# Patient Record
Sex: Female | Born: 1990 | Race: White | Hispanic: No | Marital: Single | Smoking: Former smoker
Health system: Southern US, Community
[De-identification: ages and names within clinical notes are randomized; demographics above are authoritative.]

## PROBLEM LIST (undated history)

## (undated) DIAGNOSIS — R51 Headache: Secondary | ICD-10-CM

## (undated) DIAGNOSIS — Z789 Other specified health status: Secondary | ICD-10-CM

## (undated) DIAGNOSIS — R519 Headache, unspecified: Secondary | ICD-10-CM

## (undated) HISTORY — PX: NO PAST SURGERIES: SHX2092

## (undated) HISTORY — DX: Other specified health status: Z78.9

---

## 2012-06-03 NOTE — L&D Delivery Note (Signed)
Delivery Note At 12:27 AM a viable and healthy female was delivered via Vaginal, Spontaneous Delivery (Presentation: Right Occiput Anterior).  APGAR: 8, 9; weight -pending   Placenta status: Intact, Spontaneous.  Cord:  with the following complications: None.  Cord pH: not sent  Anesthesia: Epidural  Episiotomy: None Lacerations: Labial;Vaginal Suture Repair: 3.0 vicryl rapide Est. Blood Loss (mL): 250  Mom to postpartum.  Baby to Couplet care / Skin to Skin.  Nylan Nakatani R 05/30/2013, 1:00 AM

## 2012-10-12 ENCOUNTER — Emergency Department (HOSPITAL_COMMUNITY)
Admission: EM | Admit: 2012-10-12 | Discharge: 2012-10-12 | Disposition: A | Discharge status: Home / Self Care | Payer: Managed Care, Other (non HMO) | Attending: Emergency Medicine | Admitting: Emergency Medicine

## 2012-10-12 ENCOUNTER — Emergency Department (HOSPITAL_COMMUNITY): Payer: Managed Care, Other (non HMO)

## 2012-10-12 ENCOUNTER — Encounter (HOSPITAL_COMMUNITY): Payer: Self-pay | Admitting: Emergency Medicine

## 2012-10-12 DIAGNOSIS — Z87891 Personal history of nicotine dependence: Secondary | ICD-10-CM | POA: Insufficient documentation

## 2012-10-12 DIAGNOSIS — Z3490 Encounter for supervision of normal pregnancy, unspecified, unspecified trimester: Secondary | ICD-10-CM

## 2012-10-12 DIAGNOSIS — Z202 Contact with and (suspected) exposure to infections with a predominantly sexual mode of transmission: Secondary | ICD-10-CM

## 2012-10-12 DIAGNOSIS — Z3201 Encounter for pregnancy test, result positive: Secondary | ICD-10-CM | POA: Insufficient documentation

## 2012-10-12 LAB — URINALYSIS, ROUTINE W REFLEX MICROSCOPIC
Bilirubin Urine: NEGATIVE
Glucose, UA: NEGATIVE mg/dL
Hgb urine dipstick: NEGATIVE
Ketones, ur: NEGATIVE mg/dL
Nitrite: NEGATIVE
Protein, ur: NEGATIVE mg/dL
Specific Gravity, Urine: 1.031 — ABNORMAL HIGH (ref 1.005–1.030)
Urobilinogen, UA: 0.2 mg/dL (ref 0.0–1.0)
pH: 5.5 (ref 5.0–8.0)

## 2012-10-12 LAB — OB RESULTS CONSOLE GC/CHLAMYDIA: Chlamydia: NEGATIVE

## 2012-10-12 LAB — URINE MICROSCOPIC-ADD ON

## 2012-10-12 LAB — HCG, QUANTITATIVE, PREGNANCY: hCG, Beta Chain, Quant, S: 467 m[IU]/mL — ABNORMAL HIGH (ref <5)

## 2012-10-12 LAB — POCT PREGNANCY, URINE: Preg Test, Ur: POSITIVE — AB

## 2012-10-12 LAB — WET PREP, GENITAL

## 2012-10-12 MED ORDER — AZITHROMYCIN 250 MG PO TABS
1000.0000 mg | ORAL_TABLET | Freq: Once | ORAL | Status: AC
  Administered 2012-10-12: 1000 mg via ORAL
  Filled 2012-10-12: qty 4

## 2012-10-12 MED ORDER — CEFTRIAXONE SODIUM 250 MG IJ SOLR
250.0000 mg | Freq: Once | INTRAMUSCULAR | Status: AC
  Administered 2012-10-12: 250 mg via INTRAMUSCULAR
  Filled 2012-10-12: qty 250

## 2012-10-12 NOTE — ED Provider Notes (Signed)
History     CSN: [id number]  Arrival date & time 10/12/12  1058   First MD Initiated Contact with Patient 10/12/12 1507      Chief Complaint  Patient presents with  . Exposure to STD    (Consider location/radiation/quality/duration/timing/severity/associated sxs/prior treatment) HPI Comments: Patient presents to the ED for STD exposure. Reports that her partner was tested for possible UTI, and results came back as positive for Chlamydia. They have been sexually active since testing and she is concerned for possible infection. Denies any prior STDs. Denies any vaginal discharge, dysuria, hematuria, or increased frequency of urination. Patient also notes she took a home pregnancy test recently which was positive.  No prior pregnancies. He is experiencing slight intermittent, suprapubic cramping sensations which are short lived. Denies any recent fever, sweats, chills, nausea, vomiting, or diarrhea.  The history is provided by the patient.    History reviewed. No pertinent past medical history.  History reviewed. No pertinent past surgical history.  No family history on file.  History  Substance Use Topics  . Smoking status: Former Smoker    Types: Cigarettes  . Smokeless tobacco: Not on file  . Alcohol Use: Not on file    OB History   Grav Para Term Preterm Abortions TAB SAB Ect Mult Living                  Review of Systems  Genitourinary:       STD exposure, + preg  All other systems reviewed and are negative.    Allergies  Review of patient's allergies indicates no known allergies.  Home Medications   Current Outpatient Rx  Name  Route  Sig  Dispense  Refill  . aspirin-acetaminophen-caffeine (EXCEDRIN MIGRAINE) 250-250-65 MG per tablet   Oral   Take 1 tablet by mouth every 6 (six) hours as needed for pain.           BP 129/71  Pulse 89  Temp(Src) 98.5 F (36.9 C) (Oral)  Resp 16  SpO2 100%  LMP 09/08/2012  Physical Exam  Nursing note and vitals  reviewed. Constitutional: She is oriented to person, place, and time. She appears well-developed and well-nourished.  HENT:  Head: Normocephalic and atraumatic.  Eyes: Conjunctivae and EOM are normal.  Neck: Normal range of motion. Neck supple.  Cardiovascular: Normal rate, regular rhythm and normal heart sounds.   Pulmonary/Chest: Effort normal and breath sounds normal. She has no wheezes.  Abdominal: Soft. Bowel sounds are normal. There is no tenderness. There is no guarding.  Genitourinary: Vagina normal. There is no lesion on the right labia. There is no lesion on the left labia. Cervix exhibits no motion tenderness. Right adnexum displays no tenderness. Left adnexum displays no tenderness. No bleeding around the vagina. No vaginal discharge found.  Cervical os closed, no vaginal discharge or bleeding, no adnexal or CMT  Musculoskeletal: Normal range of motion.  Neurological: She is alert and oriented to person, place, and time.  Skin: Skin is warm and dry.  Psychiatric: She has a normal mood and affect.    ED Course  Procedures (including critical care time)  Labs Reviewed  WET PREP, GENITAL - Abnormal; Notable for the following:    Clue Cells Wet Prep HPF POC FEW (*)    WBC, Wet Prep HPF POC MODERATE (*)    All other components within normal limits  URINALYSIS, ROUTINE W REFLEX MICROSCOPIC - Abnormal; Notable for the following:    APPearance CLOUDY (*)  Specific Gravity, Urine 1.031 (*)    Leukocytes, UA LARGE (*)    All other components within normal limits  URINE MICROSCOPIC-ADD ON - Abnormal; Notable for the following:    Squamous Epithelial / LPF FEW (*)    Bacteria, UA MANY (*)    All other components within normal limits  HCG, QUANTITATIVE, PREGNANCY - Abnormal; Notable for the following:    hCG, Beta Chain, Quant, S 467 (*)    All other components within normal limits  POCT PREGNANCY, URINE - Abnormal; Notable for the following:    Preg Test, Ur POSITIVE (*)     All other components within normal limits  POCT PREGNANCY, URINE - Abnormal; Notable for the following:    Preg Test, Ur POSITIVE (*)    All other components within normal limits  URINE CULTURE  GC/CHLAMYDIA PROBE AMP   US Ob Comp Less 14 Wks  10/12/2012  *RADIOLOGY REPORT*  Clinical Data: Positive pregnancy test.  Cramping.  OBSTETRIC <14 WK [place] AND TRANSVAGINAL OB US  Technique:  Both transabdominal and transvaginal ultrasound examinations were performed for complete evaluation of the gestation as well as the maternal uterus, adnexal regions, and pelvic cul-de-sac.  Transvaginal technique was performed to assess early pregnancy.  Comparison:  None.  Intrauterine gestational sac:  Not identified Yolk sac: Not identified Embryo: Not identified  Maternal uterus/adnexae: The endometrium is thickened to 20 mm.  No fluid in the canal.  The ovaries are normal.  There is a hypoechoic region within the right ovary measuring 1.8 cm.  This potentially could represent a corpus luteal cyst.  Small amount free fluid in the posterior cul-de-sac and adjacent the right adnexa.  No evidence of adnexal mass.  IMPRESSION:  1.  No intrauterine gestational sac with thickened endometrium. Findings consistent with early intrauterine pregnancy versus ectopic pregnancy versus spontaneous abortion in progress. 2.  Small amount free fluid within the right adnexa and posterior cul-de-sac.  No evidence of right adnexal mass.   Original Report Authenticated By: Genevive Bi, M.D.    US Ob Transvaginal  10/12/2012  *RADIOLOGY REPORT*  Clinical Data: Positive pregnancy test.  Cramping.  OBSTETRIC <14 WK [place] AND TRANSVAGINAL OB US  Technique:  Both transabdominal and transvaginal ultrasound examinations were performed for complete evaluation of the gestation as well as the maternal uterus, adnexal regions, and pelvic cul-de-sac.  Transvaginal technique was performed to assess early pregnancy.  Comparison:  None.  Intrauterine  gestational sac:  Not identified Yolk sac: Not identified Embryo: Not identified  Maternal uterus/adnexae: The endometrium is thickened to 20 mm.  No fluid in the canal.  The ovaries are normal.  There is a hypoechoic region within the right ovary measuring 1.8 cm.  This potentially could represent a corpus luteal cyst.  Small amount free fluid in the posterior cul-de-sac and adjacent the right adnexa.  No evidence of adnexal mass.  IMPRESSION:  1.  No intrauterine gestational sac with thickened endometrium. Findings consistent with early intrauterine pregnancy versus ectopic pregnancy versus spontaneous abortion in progress. 2.  Small amount free fluid within the right adnexa and posterior cul-de-sac.  No evidence of right adnexal mass.   Original Report Authenticated By: Genevive Bi, M.D.      1. Pregnancy   2. Exposure to STD       MDM   22 y.o. F presenting to the ED for STD exposure and positive home pregnancy test.  u-preg Positive.   Cervical os closed, no evidence of  vaginal discharge or vaginal bleeding.  HCG 467, correlating with early pregnancy of 2-3 weeks.  No intrauterine gestational sac visualized- early pregnancy versus ectopic pregnancy versus spontaneous abortion.  The patient treated in the ED for GC/CHL.   [facility].  Discussed plan with patient, she agreed. Return precautions advised.        Garlon Hatchet, PA-C 10/12/12 2350

## 2012-10-12 NOTE — ED Notes (Signed)
Pt escorted to discharge window. Verbalized understanding discharge instructions. In no acute distress.   

## 2012-10-12 NOTE — ED Notes (Signed)
Patient transported to MRI 

## 2012-10-12 NOTE — ED Notes (Signed)
Pt states "my boyfriend tested positive for an STD and I need to be checked" Pt denies any discharge or symptoms at this time.

## 2012-10-13 LAB — URINE CULTURE: Colony Count: NO GROWTH

## 2012-10-13 NOTE — ED Provider Notes (Signed)
Medical screening examination/treatment/procedure(s) were performed by non-physician practitioner and as supervising physician I was immediately available for consultation/collaboration.   Gavin Pound. Eyob Godlewski, MD 10/13/12 1423

## 2012-10-14 LAB — GC/CHLAMYDIA PROBE AMP: GC Probe RNA: NEGATIVE

## 2012-10-16 ENCOUNTER — Telehealth (HOSPITAL_COMMUNITY): Payer: Self-pay | Admitting: Emergency Medicine

## 2012-10-16 NOTE — ED Notes (Signed)
+   Chlamydia + Gonorrhea Patient treated with Rocephin And Zithromax-[facility] faxed 

## 2012-10-17 ENCOUNTER — Telehealth (HOSPITAL_COMMUNITY): Payer: Self-pay | Admitting: Emergency Medicine

## 2012-10-18 ENCOUNTER — Telehealth (HOSPITAL_COMMUNITY): Payer: Self-pay | Admitting: Emergency Medicine

## 2012-10-19 ENCOUNTER — Other Ambulatory Visit: Payer: Managed Care, Other (non HMO)

## 2012-10-19 DIAGNOSIS — E349 Endocrine disorder, unspecified: Secondary | ICD-10-CM

## 2012-10-20 ENCOUNTER — Telehealth (HOSPITAL_COMMUNITY): Payer: Self-pay | Admitting: Emergency Medicine

## 2012-10-20 LAB — HCG, QUANTITATIVE, PREGNANCY: hCG, Beta Chain, Quant, S: 11544.4 m[IU]/mL

## 2012-10-20 NOTE — ED Notes (Signed)
Unable to contact patient via phone. Sent letter. °

## 2012-12-22 ENCOUNTER — Ambulatory Visit (INDEPENDENT_AMBULATORY_CARE_PROVIDER_SITE_OTHER): Payer: Medicaid Other

## 2012-12-22 VITALS — BP 138/81 | Temp 97.8°F | Ht 63.0 in | Wt 128.0 lb

## 2012-12-22 DIAGNOSIS — Z3400 Encounter for supervision of normal first pregnancy, unspecified trimester: Secondary | ICD-10-CM

## 2012-12-22 DIAGNOSIS — Z3402 Encounter for supervision of normal first pregnancy, second trimester: Secondary | ICD-10-CM

## 2012-12-22 LAB — POCT URINALYSIS DIP (DEVICE)
Bilirubin Urine: NEGATIVE
Glucose, UA: NEGATIVE mg/dL
Hgb urine dipstick: NEGATIVE
Nitrite: NEGATIVE
Urobilinogen, UA: 0.2 mg/dL (ref 0.0–1.0)
pH: 5.5 (ref 5.0–8.0)

## 2012-12-22 NOTE — Progress Notes (Signed)
Pulse- 94  Edema- feet Weight gain 25-35lbs New ob packet given

## 2012-12-23 ENCOUNTER — Encounter: Payer: Self-pay | Admitting: *Deleted

## 2012-12-23 DIAGNOSIS — Z3480 Encounter for supervision of other normal pregnancy, unspecified trimester: Secondary | ICD-10-CM | POA: Insufficient documentation

## 2012-12-23 LAB — OBSTETRIC PANEL
Basophils Absolute: 0 10*3/uL (ref 0.0–0.1)
Hepatitis B Surface Ag: NEGATIVE
Lymphocytes Relative: 17 % (ref 12–46)
Lymphs Abs: 1.9 10*3/uL (ref 0.7–4.0)
Neutro Abs: 8.6 10*3/uL — ABNORMAL HIGH (ref 1.7–7.7)
Neutrophils Relative %: 76 % (ref 43–77)
Platelets: 311 10*3/uL (ref 150–400)
RBC: 4.36 MIL/uL (ref 3.87–5.11)
RDW: 13.3 % (ref 11.5–15.5)
Rubella: 1.31 Index — ABNORMAL HIGH (ref <0.90)
WBC: 11.3 10*3/uL — ABNORMAL HIGH (ref 4.0–10.5)

## 2012-12-23 LAB — HIV ANTIBODY (ROUTINE TESTING W REFLEX): HIV: NONREACTIVE

## 2012-12-24 LAB — CULTURE, OB URINE: Colony Count: 30000

## 2012-12-25 ENCOUNTER — Encounter: Payer: Self-pay | Admitting: Family Medicine

## 2012-12-25 ENCOUNTER — Ambulatory Visit (HOSPITAL_COMMUNITY)
Admission: RE | Admit: 2012-12-25 | Discharge: 2012-12-25 | Disposition: A | Discharge status: Home / Self Care | Payer: Medicaid Other | Source: Ambulatory Visit | Attending: Obstetrics & Gynecology | Admitting: Obstetrics & Gynecology

## 2012-12-25 ENCOUNTER — Ambulatory Visit (INDEPENDENT_AMBULATORY_CARE_PROVIDER_SITE_OTHER): Payer: Medicaid Other | Admitting: Family Medicine

## 2012-12-25 VITALS — BP 119/76 | Temp 98.3°F | Wt 127.8 lb

## 2012-12-25 DIAGNOSIS — Z3482 Encounter for supervision of other normal pregnancy, second trimester: Secondary | ICD-10-CM

## 2012-12-25 DIAGNOSIS — Z3400 Encounter for supervision of normal first pregnancy, unspecified trimester: Secondary | ICD-10-CM

## 2012-12-25 DIAGNOSIS — Z3402 Encounter for supervision of normal first pregnancy, second trimester: Secondary | ICD-10-CM

## 2012-12-25 DIAGNOSIS — Z3689 Encounter for other specified antenatal screening: Secondary | ICD-10-CM | POA: Insufficient documentation

## 2012-12-25 LAB — POCT URINALYSIS DIP (DEVICE)
Glucose, UA: NEGATIVE mg/dL
Nitrite: NEGATIVE
Protein, ur: NEGATIVE mg/dL
Specific Gravity, Urine: 1.02 (ref 1.005–1.030)
Urobilinogen, UA: 0.2 mg/dL (ref 0.0–1.0)

## 2012-12-25 NOTE — Progress Notes (Signed)
P=81, Already received new patient information.

## 2012-12-25 NOTE — Patient Instructions (Signed)
Pregnancy - Second Trimester The second trimester of pregnancy (3 to 6 months) is a period of rapid growth for you and your baby. At the end of the sixth month, your baby is about 9 inches long and weighs 1 1/2 pounds. You will begin to feel the baby move between 18 and 20 weeks of the pregnancy. This is called quickening. Weight gain is faster. A clear fluid (colostrum) may leak out of your breasts. You may feel small contractions of the womb (uterus). This is known as false labor or Braxton-Hicks contractions. This is like a practice for labor when the baby is ready to be born. Usually, the problems with morning sickness have usually passed by the end of your first trimester. Some women develop small dark blotches (called cholasma, mask of pregnancy) on their face that usually goes away after the baby is born. Exposure to the sun makes the blotches worse. Acne may also develop in some pregnant women and pregnant women who have acne, may find that it goes away. PRENATAL EXAMS  Blood work may continue to be done during prenatal exams. These tests are done to check on your health and the probable health of your baby. Blood work is used to follow your blood levels (hemoglobin). Anemia (low hemoglobin) is common during pregnancy. Iron and vitamins are given to help prevent this. You will also be checked for diabetes between 24 and 28 weeks of the pregnancy. Some of the previous blood tests may be repeated.  The size of the uterus is measured during each visit. This is to make sure that the baby is continuing to grow properly according to the dates of the pregnancy.  Your blood pressure is checked every prenatal visit. This is to make sure you are not getting toxemia.  Your urine is checked to make sure you do not have an infection, diabetes or protein in the urine.  Your weight is checked often to make sure gains are happening at the suggested rate. This is to ensure that both you and your baby are  growing normally.  Sometimes, an ultrasound is performed to confirm the proper growth and development of the baby. This is a test which bounces harmless sound waves off the baby so your caregiver can more accurately determine due dates. Sometimes, a test is done on the amniotic fluid surrounding the baby. This test is called an amniocentesis. The amniotic fluid is obtained by sticking a needle into the belly (abdomen). This is done to check the chromosomes in instances where there is a concern about possible genetic problems with the baby. It is also sometimes done near the end of pregnancy if an early delivery is required. In this case, it is done to help make sure the baby's lungs are mature enough for the baby to live outside of the womb. CHANGES OCCURING IN THE SECOND TRIMESTER OF PREGNANCY Your body goes through many changes during pregnancy. They vary from person to person. Talk to your caregiver about changes you notice that you are concerned about.  During the second trimester, you will likely have an increase in your appetite. It is normal to have cravings for certain foods. This varies from person to person and pregnancy to pregnancy.  Your lower abdomen will begin to bulge.  You may have to urinate more often because the uterus and baby are pressing on your bladder. It is also common to get more bladder infections during pregnancy. You can help this by drinking lots of fluids   and emptying your bladder before and after intercourse.  You may begin to get stretch marks on your hips, abdomen, and breasts. These are normal changes in the body during pregnancy. There are no exercises or medicines to take that prevent this change.  You may begin to develop swollen and bulging veins (varicose veins) in your legs. Wearing support hose, elevating your feet for 15 minutes, 3 to 4 times a day and limiting salt in your diet helps lessen the problem.  Heartburn may develop as the uterus grows and  pushes up against the stomach. Antacids recommended by your caregiver helps with this problem. Also, eating smaller meals 4 to 5 times a day helps.  Constipation can be treated with a stool softener or adding bulk to your diet. Drinking lots of fluids, and eating vegetables, fruits, and whole grains are helpful.  Exercising is also helpful. If you have been very active up until your pregnancy, most of these activities can be continued during your pregnancy. If you have been less active, it is helpful to start an exercise program such as walking.  Hemorrhoids may develop at the end of the second trimester. Warm sitz baths and hemorrhoid cream recommended by your caregiver helps hemorrhoid problems.  Backaches may develop during this time of your pregnancy. Avoid heavy lifting, wear low heal shoes, and practice good posture to help with backache problems.  Some pregnant women develop tingling and numbness of their hand and fingers because of swelling and tightening of ligaments in the wrist (carpel tunnel syndrome). This goes away after the baby is born.  As your breasts enlarge, you may have to get a bigger bra. Get a comfortable, cotton, support bra. Do not get a nursing bra until the last month of the pregnancy if you will be nursing the baby.  You may get a dark line from your belly button to the pubic area called the linea nigra.  You may develop rosy cheeks because of increase blood flow to the face.  You may develop spider looking lines of the face, neck, arms, and chest. These go away after the baby is born. HOME CARE INSTRUCTIONS   It is extremely important to avoid all smoking, herbs, alcohol, and unprescribed drugs during your pregnancy. These chemicals affect the formation and growth of the baby. Avoid these chemicals throughout the pregnancy to ensure the delivery of a healthy infant.  Most of your home care instructions are the same as suggested for the first trimester of your  pregnancy. Keep your caregiver's appointments. Follow your caregiver's instructions regarding medicine use, exercise, and diet.  During pregnancy, you are providing food for you and your baby. Continue to eat regular, well-balanced meals. Choose foods such as meat, fish, milk and other low fat dairy products, vegetables, fruits, and whole-grain breads and cereals. Your caregiver will tell you of the ideal weight gain.  A physical sexual relationship may be continued up until near the end of pregnancy if there are no other problems. Problems could include early (premature) leaking of amniotic fluid from the membranes, vaginal bleeding, abdominal pain, or other medical or pregnancy problems.  Exercise regularly if there are no restrictions. Check with your caregiver if you are unsure of the safety of some of your exercises. The greatest weight gain will occur in the last 2 trimesters of pregnancy. Exercise will help you:  Control your weight.  Get you in shape for labor and delivery.  Lose weight after you have the baby.  Wear   a good support or jogging bra for breast tenderness during pregnancy. This may help if worn during sleep. Pads or tissues may be used in the bra if you are leaking colostrum.  Do not use hot tubs, steam rooms or saunas throughout the pregnancy.  Wear your seat belt at all times when driving. This protects you and your baby if you are in an accident.  Avoid raw meat, uncooked cheese, cat litter boxes, and soil used by cats. These carry germs that can cause birth defects in the baby.  The second trimester is also a good time to visit your dentist for your dental health if this has not been done yet. Getting your teeth cleaned is okay. Use a soft toothbrush. Brush gently during pregnancy.  It is easier to leak urine during pregnancy. Tightening up and strengthening the pelvic muscles will help with this problem. Practice stopping your urination while you are going to the  bathroom. These are the same muscles you need to strengthen. It is also the muscles you would use as if you were trying to stop from passing gas. You can practice tightening these muscles up 10 times a set and repeating this about 3 times per day. Once you know what muscles to tighten up, do not perform these exercises during urination. It is more likely to contribute to an infection by backing up the urine.  Ask for help if you have financial, counseling, or nutritional needs during pregnancy. Your caregiver will be able to offer counseling for these needs as well as refer you for other special needs.  Your skin may become oily. If so, wash your face with mild soap, use non-greasy moisturizer and oil or cream based makeup. MEDICINES AND DRUG USE IN PREGNANCY  Take prenatal vitamins as directed. The vitamin should contain 1 milligram of folic acid. Keep all vitamins out of reach of children. Only a couple vitamins or tablets containing iron may be fatal to a baby or young child when ingested.  Avoid use of all medicines, including herbs, over-the-counter medicines, not prescribed or suggested by your caregiver. Only take over-the-counter or prescription medicines for pain, discomfort, or fever as directed by your caregiver. Do not use aspirin.  Let your caregiver also know about herbs you may be using.  Alcohol is related to a number of birth defects. This includes fetal alcohol syndrome. All alcohol, in any form, should be avoided completely. Smoking will cause low birth rate and premature babies.  Street or illegal drugs are very harmful to the baby. They are absolutely forbidden. A baby born to an addicted mother will be addicted at birth. The baby will go through the same withdrawal an adult does. SEEK MEDICAL CARE IF:  You have any concerns or worries during your pregnancy. It is better to call with your questions if you feel they cannot wait, rather than worry about them. SEEK IMMEDIATE  MEDICAL CARE IF:   An unexplained oral temperature above 102 F (38.9 C) develops, or as your caregiver suggests.  You have leaking of fluid from the vagina (birth canal). If leaking membranes are suspected, take your temperature and tell your caregiver of this when you call.  There is vaginal spotting, bleeding, or passing clots. Tell your caregiver of the amount and how many pads are used. Light spotting in pregnancy is common, especially following intercourse.  You develop a bad smelling vaginal discharge with a change in the color from clear to white.  You continue to feel   sick to your stomach (nauseated) and have no relief from remedies suggested. You vomit blood or coffee ground-like materials.  You lose more than 2 pounds of weight or gain more than 2 pounds of weight over 1 week, or as suggested by your caregiver.  You notice swelling of your face, hands, feet, or legs.  You get exposed to German measles and have never had them.  You are exposed to fifth disease or chickenpox.  You develop belly (abdominal) pain. Round ligament discomfort is a common non-cancerous (benign) cause of abdominal pain in pregnancy. Your caregiver still must evaluate you.  You develop a bad headache that does not go away.  You develop fever, diarrhea, pain with urination, or shortness of breath.  You develop visual problems, blurry, or double vision.  You fall or are in a car accident or any kind of trauma.  There is mental or physical violence at home. Document Released: 05/14/2001 Document Revised: 02/12/2012 Document Reviewed: 11/16/2008 [employer] Patient Information 2014 [employer].  

## 2012-12-25 NOTE — Progress Notes (Signed)
Subjective:    Julie [name] is being seen today for her first obstetrical visit.  This is not a planned pregnancy. She is at [redacted]w[redacted]d gestation. Relationship with FOB: unsure who the father is. Patient does intend to breast feed. Pregnancy history fully reviewed.  Menstrual History: OB History   Grav Para Term Preterm Abortions TAB SAB Ect Mult Living   1               Menarche age: 22  Patient's last menstrual period was 09/02/2012.    The following portions of the patient's history were reviewed and updated as appropriate: allergies, current medications, past family history, past medical history, past social history, past surgical history and problem list.  Review of Systems A comprehensive review of systems was negative.    Objective:    BP 119/76  Temp(Src) 98.3 F (36.8 C)  Wt 127 lb 12.8 oz (57.97 kg)  BMI 22.64 kg/m2  LMP 09/02/2012  General Appearance:    Alert, cooperative, no distress, appears stated age  Head:    Normocephalic, without obvious abnormality, atraumatic  Eyes:  PERRLA  Ears:    Normal external  Nose:   Nares normal, septum midline, mucosa normal, no drainage    or sinus tenderness  Throat:   Lips, mucosa, and tongue normal; teeth and gums normal  Neck:   Supple, symmetrical, trachea midline, no adenopathy;    thyroid:  no enlargement/tenderness/nodules;   Back:     Symmetric, no curvature, ROM normal, no CVA tenderness  Lungs:     Clear to auscultation bilaterally, respirations unlabored  Chest Wall:    No tenderness or deformity   Heart:    Regular rate and rhythm, S1 and S2 normal, no murmur, rub   or gallop  Breast Exam:    deferred  Abdomen:     Soft, non-tender, bowel sounds active all four quadrants,    no masses, no organomegaly  Genitalia:    Normal female without lesion, discharge or tenderness. Uterus feels about the size of 16 weeks. No tenderness. Normal vaginal, urethra, urethral meatus.   Rectal:    Normal tone, normal prostate, no masses  or tenderness;   guaiac negative stool  Extremities:   Extremities normal, atraumatic, no cyanosis or edema  Pulses:   2+ and symmetric all extremities               Assessment:    Pregnancy at 16 and 2/7 weeks    Plan:    Initial labs reviewed- toc for chlamydia negative. Prenatal vitamins rxd. Problem list reviewed and updated. QUAD screen declined Role of ultrasound in pregnancy discussed; fetal survey: requested. Amniocentesis discussed: not indicated. Follow up in 4 weeks. 50% of 30 min visit spent on counseling and coordination of care.

## 2012-12-28 ENCOUNTER — Encounter: Payer: Self-pay | Admitting: Obstetrics & Gynecology

## 2013-01-20 ENCOUNTER — Encounter: Payer: Medicaid Other | Admitting: Advanced Practice Midwife

## 2013-05-29 ENCOUNTER — Inpatient Hospital Stay (HOSPITAL_COMMUNITY): Payer: Managed Care, Other (non HMO) | Admitting: Anesthesiology

## 2013-05-29 ENCOUNTER — Encounter (HOSPITAL_COMMUNITY): Payer: Self-pay

## 2013-05-29 ENCOUNTER — Encounter (HOSPITAL_COMMUNITY): Payer: Managed Care, Other (non HMO) | Admitting: Anesthesiology

## 2013-05-29 ENCOUNTER — Inpatient Hospital Stay (HOSPITAL_COMMUNITY)
Admission: AD | Admit: 2013-05-29 | Discharge: 2013-05-31 | DRG: 775 | Disposition: A | Discharge status: Home / Self Care | Payer: Managed Care, Other (non HMO) | Source: Ambulatory Visit | Attending: Obstetrics & Gynecology | Admitting: Obstetrics & Gynecology

## 2013-05-29 DIAGNOSIS — D509 Iron deficiency anemia, unspecified: Secondary | ICD-10-CM | POA: Diagnosis present

## 2013-05-29 DIAGNOSIS — O99892 Other specified diseases and conditions complicating childbirth: Secondary | ICD-10-CM | POA: Diagnosis present

## 2013-05-29 DIAGNOSIS — O9902 Anemia complicating childbirth: Secondary | ICD-10-CM | POA: Diagnosis present

## 2013-05-29 DIAGNOSIS — Z22330 Carrier of Group B streptococcus: Secondary | ICD-10-CM

## 2013-05-29 DIAGNOSIS — Z87891 Personal history of nicotine dependence: Secondary | ICD-10-CM

## 2013-05-29 DIAGNOSIS — Z3490 Encounter for supervision of normal pregnancy, unspecified, unspecified trimester: Secondary | ICD-10-CM

## 2013-05-29 LAB — CBC
HCT: 34.8 % — ABNORMAL LOW (ref 36.0–46.0)
Hemoglobin: 11.9 g/dL — ABNORMAL LOW (ref 12.0–15.0)
MCH: 30.7 pg (ref 26.0–34.0)
MCHC: 34.2 g/dL (ref 30.0–36.0)
MCV: 89.9 fL (ref 78.0–100.0)
RDW: 13.2 % (ref 11.5–15.5)

## 2013-05-29 MED ORDER — DIPHENHYDRAMINE HCL 50 MG/ML IJ SOLN: 12.5000 mg | INTRAMUSCULAR | Status: DC | PRN

## 2013-05-29 MED ORDER — TERBUTALINE SULFATE 1 MG/ML IJ SOLN: 0.2500 mg | Freq: Once | INTRAMUSCULAR | Status: AC | PRN

## 2013-05-29 MED ORDER — FENTANYL 2.5 MCG/ML BUPIVACAINE 1/10 % EPIDURAL INFUSION (WH - ANES)
14.0000 mL/h | INTRAMUSCULAR | Status: DC | PRN
  Filled 2013-05-29: qty 125

## 2013-05-29 MED ORDER — PHENYLEPHRINE 40 MCG/ML (10ML) SYRINGE FOR IV PUSH (FOR BLOOD PRESSURE SUPPORT)
80.0000 ug | PREFILLED_SYRINGE | INTRAVENOUS | Status: DC | PRN
  Filled 2013-05-29: qty 2

## 2013-05-29 MED ORDER — PENICILLIN G POTASSIUM 5000000 UNITS IJ SOLR
5.0000 10*6.[IU] | Freq: Once | INTRAVENOUS | Status: AC
  Administered 2013-05-29: 5 10*6.[IU] via INTRAVENOUS
  Filled 2013-05-29: qty 5

## 2013-05-29 MED ORDER — LACTATED RINGERS IV SOLN
500.0000 mL | Freq: Once | INTRAVENOUS | Status: AC
  Administered 2013-05-29: 500 mL via INTRAVENOUS

## 2013-05-29 MED ORDER — EPHEDRINE 5 MG/ML INJ
10.0000 mg | INTRAVENOUS | Status: DC | PRN
  Filled 2013-05-29: qty 4
  Filled 2013-05-29: qty 2

## 2013-05-29 MED ORDER — LACTATED RINGERS IV SOLN: 500.0000 mL | INTRAVENOUS | Status: DC | PRN

## 2013-05-29 MED ORDER — FENTANYL 2.5 MCG/ML BUPIVACAINE 1/10 % EPIDURAL INFUSION (WH - ANES)
INTRAMUSCULAR | Status: DC | PRN
  Administered 2013-05-29: 14 mL/h via EPIDURAL

## 2013-05-29 MED ORDER — OXYTOCIN BOLUS FROM INFUSION
500.0000 mL | INTRAVENOUS | Status: DC
  Administered 2013-05-30: 500 mL via INTRAVENOUS

## 2013-05-29 MED ORDER — ONDANSETRON HCL 4 MG/2ML IJ SOLN: 4.0000 mg | Freq: Four times a day (QID) | INTRAMUSCULAR | Status: DC | PRN

## 2013-05-29 MED ORDER — IBUPROFEN 600 MG PO TABS: 600.0000 mg | ORAL_TABLET | Freq: Four times a day (QID) | ORAL | Status: DC | PRN

## 2013-05-29 MED ORDER — LIDOCAINE HCL (PF) 1 % IJ SOLN
INTRAMUSCULAR | Status: DC | PRN
  Administered 2013-05-29 (×2): 4 mL

## 2013-05-29 MED ORDER — BUTORPHANOL TARTRATE 1 MG/ML IJ SOLN: 1.0000 mg | INTRAMUSCULAR | Status: DC | PRN

## 2013-05-29 MED ORDER — PENICILLIN G POTASSIUM 5000000 UNITS IJ SOLR
2.5000 10*6.[IU] | INTRAVENOUS | Status: DC
  Administered 2013-05-29: 2.5 10*6.[IU] via INTRAVENOUS
  Filled 2013-05-29 (×5): qty 2.5

## 2013-05-29 MED ORDER — CITRIC ACID-SODIUM CITRATE 334-500 MG/5ML PO SOLN: 30.0000 mL | ORAL | Status: DC | PRN

## 2013-05-29 MED ORDER — FLEET ENEMA 7-19 GM/118ML RE ENEM: 1.0000 | ENEMA | RECTAL | Status: DC | PRN

## 2013-05-29 MED ORDER — OXYTOCIN 40 UNITS IN LACTATED RINGERS INFUSION - SIMPLE MED
1.0000 m[IU]/min | INTRAVENOUS | Status: DC
  Administered 2013-05-29: 2 m[IU]/min via INTRAVENOUS

## 2013-05-29 MED ORDER — PHENYLEPHRINE 40 MCG/ML (10ML) SYRINGE FOR IV PUSH (FOR BLOOD PRESSURE SUPPORT)
80.0000 ug | PREFILLED_SYRINGE | INTRAVENOUS | Status: DC | PRN
Start: 2013-05-29 — End: 2013-05-30
  Filled 2013-05-29: qty 2
  Filled 2013-05-29: qty 10

## 2013-05-29 MED ORDER — OXYTOCIN 40 UNITS IN LACTATED RINGERS INFUSION - SIMPLE MED
62.5000 mL/h | INTRAVENOUS | Status: DC
  Filled 2013-05-29: qty 1000

## 2013-05-29 MED ORDER — LIDOCAINE HCL (PF) 1 % IJ SOLN
30.0000 mL | INTRAMUSCULAR | Status: DC | PRN
  Filled 2013-05-29 (×2): qty 30

## 2013-05-29 MED ORDER — ACETAMINOPHEN 325 MG PO TABS: 650.0000 mg | ORAL_TABLET | ORAL | Status: DC | PRN

## 2013-05-29 MED ORDER — LACTATED RINGERS IV SOLN
INTRAVENOUS | Status: DC
  Administered 2013-05-29: 19:00:00 via INTRAVENOUS

## 2013-05-29 MED ORDER — EPHEDRINE 5 MG/ML INJ
10.0000 mg | INTRAVENOUS | Status: DC | PRN
  Filled 2013-05-29: qty 2

## 2013-05-29 MED ORDER — OXYCODONE-ACETAMINOPHEN 5-325 MG PO TABS: 1.0000 | ORAL_TABLET | ORAL | Status: DC | PRN

## 2013-05-29 NOTE — H&P (Signed)
Julie [name] is a 22 y.o. female G1 at 22 wks, admitted with leaking fluid, Amnisure (+) for ruptured membranes, clear large gush of fluid. No vaginal bleeding. Good FM, some contrxns.  Maternal Medical History:  Reason for admission: Rupture of membranes and contractions.    P[facility]are started at [facility] in 1st trimester, transfer of care to [facility] (sees Dr Seymour Bars) at 19 wks. Teen pregnancy, FOB involved, lives with parents. Safe. Labs nl, except GBS(+), took TDaP.   OB History   Grav Para Term Preterm Abortions TAB SAB Ect Mult Living   1              Past Medical History  Diagnosis Date  . Medical history non-contributory    Past Surgical History  Procedure Laterality Date  . No past surgeries     Family History: family history is negative for Asthma, Diabetes, Depression, Heart disease, and Hyperlipidemia. Social History:  reports that she quit smoking about 8 months ago. Her smoking use included Cigarettes. She smoked 0.00 packs per day. She has never used smokeless tobacco. She reports that she does not drink alcohol or use illicit drugs.2  Prenatal Transfer Tool  Maternal Diabetes: No Maternal Ultrasounds/Referrals: Normal Isolated right CPC Fetal Ultrasounds or other Referrals:  None Maternal Substance Abuse:  No Significant Maternal Medications:  None Significant Maternal Lab Results:  Lab values include: Group B Strep positive Took TDaP  Review of Systems  Constitutional: Negative for fever.  Eyes: Negative for blurred vision.  Respiratory: Negative for shortness of breath.   Cardiovascular: Negative for chest pain.      Blood pressure 136/84, pulse 99, temperature 97.8 F (36.6 C), temperature source Oral, resp. rate 18, last menstrual period 09/02/2012. Exam Physical Exam  Physical exam:  A&O x 3, no acute distress. Pleasant HEENT neg Lungs CTA bilat CV RRR, S1S2 normal Abdo soft, non tender, non acute Extr no edema/ tenderness Pelvic 2/70%/-1/VTX,  clear fluid FHT  130s/ + accels/ no decels/ mod variab- category I Toco regular q 3 min  Prenatal labs: ABO, Rh: A/POS/-- (07/22 1624) Antibody: NEG (07/22 1624) Rubella: 1.31 (07/22 1624) RPR: NON REAC (07/22 1624)  HBsAg: NEGATIVE (07/22 1624)  HIV: NON REACTIVE (07/22 1624)  GBS:   POSITIVE Glucola normal  Assessment/Plan: 22 yo G1 at 38 wks with SROM, GBS(+), start PCN, may need labor augmentation. Anticipate SVD, EFW 6.1/2 lbs.    Paymon Rosensteel R 05/29/2013, 6:47 PM

## 2013-05-29 NOTE — Anesthesia Procedure Notes (Signed)
Epidural Patient location during procedure: OB Start time: 05/29/2013 10:11 PM  Staffing Anesthesiologist: Sonnia Strong A. Performed by: anesthesiologist   Preanesthetic Checklist Completed: patient identified, site marked, surgical consent, pre-op evaluation, timeout performed, IV checked, risks and benefits discussed and monitors and equipment checked  Epidural Patient position: sitting Prep: site prepped and draped and DuraPrep Patient monitoring: continuous pulse ox and blood pressure Approach: midline Injection technique: LOR air  Needle:  Needle type: Tuohy  Needle gauge: 17 G Needle length: 9 cm and 9 Needle insertion depth: 5 cm cm Catheter type: closed end flexible Catheter size: 19 Gauge Catheter at skin depth: 10 cm Test dose: negative and Other  Assessment Events: blood not aspirated, injection not painful, no injection resistance, negative IV test and no paresthesia  Additional Notes Patient identified. Risks and benefits discussed including failed block, incomplete  Pain control, post dural puncture headache, nerve damage, paralysis, blood pressure Changes, nausea, vomiting, reactions to medications-both toxic and allergic and post Partum back pain. All questions were answered. Patient expressed understanding and wished to proceed. Sterile technique was used throughout procedure. Epidural site was Dressed with sterile barrier dressing. No paresthesias, signs of intravascular injection Or signs of intrathecal spread were encountered.  Patient was more comfortable after the epidural was dosed. Please see RN's note for documentation of vital signs and FHR which are stable.

## 2013-05-29 NOTE — Anesthesia Preprocedure Evaluation (Signed)
Anesthesia Evaluation  Patient identified by MRN, date of birth, ID band Patient awake    Reviewed: Allergy & Precautions, H&P , Patient's Chart, lab work & pertinent test results  Airway Mallampati: III TM Distance: >3 FB Neck ROM: full    Dental no notable dental hx. (+) Teeth Intact   Pulmonary former smoker,  breath sounds clear to auscultation  Pulmonary exam normal       Cardiovascular negative cardio ROS  Rhythm:regular Rate:Normal     Neuro/Psych negative neurological ROS  negative psych ROS   GI/Hepatic Neg liver ROS, GERD-  Medicated and Controlled,  Endo/Other  negative endocrine ROS  Renal/GU negative Renal ROS  negative genitourinary   Musculoskeletal   Abdominal Normal abdominal exam  (+)   Peds  Hematology negative hematology ROS (+)   Anesthesia Other Findings   Reproductive/Obstetrics (+) Pregnancy                           Anesthesia Physical Anesthesia Plan  ASA: II  Anesthesia Plan: Epidural   Post-op Pain Management:    Induction:   Airway Management Planned:   Additional Equipment:   Intra-op Plan:   Post-operative Plan:   Informed Consent: I have reviewed the patients History and Physical, chart, labs and discussed the procedure including the risks, benefits and alternatives for the proposed anesthesia with the patient or authorized representative who has indicated his/her understanding and acceptance.     Plan Discussed with: Anesthesiologist  Anesthesia Plan Comments:         Anesthesia Quick Evaluation

## 2013-05-29 NOTE — MAU Note (Signed)
Pt presents complaining of a big gush of water at 1630 and leaking since. States she is not wearing a pad. Denies vaginal bleeding or discharge.

## 2013-05-30 ENCOUNTER — Encounter (HOSPITAL_COMMUNITY): Payer: Self-pay | Admitting: Obstetrics and Gynecology

## 2013-05-30 LAB — CBC
HCT: 30.6 % — ABNORMAL LOW (ref 36.0–46.0)
Hemoglobin: 10.3 g/dL — ABNORMAL LOW (ref 12.0–15.0)
MCV: 88.7 fL (ref 78.0–100.0)
RBC: 3.45 MIL/uL — ABNORMAL LOW (ref 3.87–5.11)
RDW: 13.1 % (ref 11.5–15.5)
WBC: 24.9 10*3/uL — ABNORMAL HIGH (ref 4.0–10.5)

## 2013-05-30 LAB — RPR: RPR Ser Ql: NONREACTIVE

## 2013-05-30 MED ORDER — SENNOSIDES-DOCUSATE SODIUM 8.6-50 MG PO TABS
2.0000 | ORAL_TABLET | ORAL | Status: DC
  Administered 2013-05-30: 2 via ORAL
  Filled 2013-05-30: qty 2

## 2013-05-30 MED ORDER — ZOLPIDEM TARTRATE 5 MG PO TABS: 5.0000 mg | ORAL_TABLET | Freq: Every evening | ORAL | Status: DC | PRN

## 2013-05-30 MED ORDER — OXYCODONE-ACETAMINOPHEN 5-325 MG PO TABS
1.0000 | ORAL_TABLET | ORAL | Status: DC | PRN
Start: 2013-05-30 — End: 2013-05-31
  Administered 2013-05-31: 1 via ORAL
  Filled 2013-05-30: qty 1

## 2013-05-30 MED ORDER — DIPHENHYDRAMINE HCL 25 MG PO CAPS: 25.0000 mg | ORAL_CAPSULE | Freq: Four times a day (QID) | ORAL | Status: DC | PRN

## 2013-05-30 MED ORDER — PRENATAL MULTIVITAMIN CH
1.0000 | ORAL_TABLET | Freq: Every day | ORAL | Status: DC
  Administered 2013-05-30 – 2013-05-31 (×2): 1 via ORAL
  Filled 2013-05-30 (×2): qty 1

## 2013-05-30 MED ORDER — BENZOCAINE-MENTHOL 20-0.5 % EX AERO
1.0000 "application " | INHALATION_SPRAY | CUTANEOUS | Status: DC | PRN
  Administered 2013-05-30: 1 via TOPICAL
  Filled 2013-05-30: qty 56

## 2013-05-30 MED ORDER — IBUPROFEN 600 MG PO TABS
600.0000 mg | ORAL_TABLET | Freq: Four times a day (QID) | ORAL | Status: DC
  Administered 2013-05-30 – 2013-05-31 (×6): 600 mg via ORAL
  Filled 2013-05-30 (×6): qty 1

## 2013-05-30 MED ORDER — TETANUS-DIPHTH-ACELL PERTUSSIS 5-2.5-18.5 LF-MCG/0.5 IM SUSP: 0.5000 mL | Freq: Once | INTRAMUSCULAR | Status: DC

## 2013-05-30 MED ORDER — ONDANSETRON HCL 4 MG/2ML IJ SOLN: 4.0000 mg | INTRAMUSCULAR | Status: DC | PRN

## 2013-05-30 MED ORDER — DIBUCAINE 1 % RE OINT: 1.0000 "application " | TOPICAL_OINTMENT | RECTAL | Status: DC | PRN

## 2013-05-30 MED ORDER — SIMETHICONE 80 MG PO CHEW: 80.0000 mg | CHEWABLE_TABLET | ORAL | Status: DC | PRN

## 2013-05-30 MED ORDER — LANOLIN HYDROUS EX OINT: TOPICAL_OINTMENT | CUTANEOUS | Status: DC | PRN

## 2013-05-30 MED ORDER — ONDANSETRON HCL 4 MG PO TABS: 4.0000 mg | ORAL_TABLET | ORAL | Status: DC | PRN

## 2013-05-30 MED ORDER — WITCH HAZEL-GLYCERIN EX PADS: 1.0000 "application " | MEDICATED_PAD | CUTANEOUS | Status: DC | PRN

## 2013-05-30 NOTE — Anesthesia Postprocedure Evaluation (Signed)
Anesthesia Post Note  Patient: Julie [name]  Procedure(s) Performed: * No procedures listed *  Anesthesia type: Epidural  Patient location: Mother/Baby  Post pain: Pain level controlled  Post assessment: Post-op Vital signs reviewed  Last Vitals:  Filed Vitals:   05/30/13 0345  BP: 120/72  Pulse: 89  Temp: 36.7 C  Resp: 18    Post vital signs: Reviewed  Level of consciousness:alert  Complications: No apparent anesthesia complications

## 2013-05-30 NOTE — Progress Notes (Signed)
Patient ID: Julie Julie [name], female   DOB: [birth year], 22 y.o.   MRN: [id number] PPD # 1 SVD  S:  Reports feeling well             Tolerating po/ No nausea or vomiting             Bleeding is light             Pain controlled with ibuprofen (OTC)             Up ad lib / ambulatory / voiding without difficulties    Newborn  Information for the patient's newborn:  Julie, Julie [name] [[id number]]  female  breast feeding  / Circumcision planning - [facility] with [facility]   O:  A & O x 3, in no apparent distress    VS:  Filed Vitals:   05/30/13 0147 05/30/13 0200 05/30/13 0245 05/30/13 0345  BP: 117/67 110/66 115/66 120/72  Pulse: 92 91 87 89  Temp:   99 F (37.2 C) 98 F (36.7 C)  TempSrc:   Oral Oral  Resp:   20 18  Height:      Weight:      SpO2:   97% 97%    LABS:  Recent Labs  05/29/13 1900 05/30/13 0552  WBC 15.6* 24.9*  HGB 11.9* 10.3*  HCT 34.8* 30.6*  PLT 381 310    Blood type: A/POS/-- (07/22 1624)  Rubella: 1.31 (07/22 1624)   I&O: I/O last 3 completed shifts: In: -  Out: 250 [Blood:250]             Lungs: Clear and unlabored  Heart: regular rate and rhythm / no murmurs  Abdomen: soft, non-tender, non-distended, normal bowel sounds             Fundus: firm, non-tender, U even  Perineum: labial/vaginal repair healing well, mild edema  Lochia: minimal  Extremities: no edema, no calf pain or tenderness, no Homans    A/P: PPD # 1  22 y.o., G1P1001   Principal Problem:   Postpartum care following vaginal delivery (12/27)   Doing well - stable status  Routine post partum orders  Anticipate discharge tomorrow   Julie Julie [name], M, MSN, CNM 05/30/2013, 10:14 AM

## 2013-05-30 NOTE — Lactation Note (Signed)
This note was copied from the chart of Julie [name]. Lactation Consultation Note Initial visit at 13 hours of age.  Mom reports breastfeeding is going well, but she has sore nipples.  Nipples are pink, encouraged expressed colostrum to be rubbed in for soreness.  Mom reports knowing hand expression and sees colostrum.  Baby skin to skin post bath, sleepy and not showing feeding cues at this time.  Encouraged mom to feed with early cues on demand.  [facility] LC resources given and discussed.  Mom to call for assist as needed.    Patient Name: Julie [name] [id number] Date: 05/30/2013 Reason for consult: Initial assessment   Maternal Data Formula Feeding for Exclusion: No Has patient been taught Hand Expression?: Yes  Feeding Feeding Type: Breast Fed Length of feed: 25 min  LATCH Score/Interventions                      Lactation Tools Discussed/Used     Consult Status Consult Status: Follow-up Date: 05/31/13 Follow-up type: In-patient    Julie [name], Julie [name] 05/30/2013, 1:36 PM

## 2013-05-31 MED ORDER — OXYCODONE-ACETAMINOPHEN 5-325 MG PO TABS: 1.0000 | ORAL_TABLET | ORAL | Status: DC | PRN

## 2013-05-31 MED ORDER — IBUPROFEN 600 MG PO TABS: 600.0000 mg | ORAL_TABLET | Freq: Four times a day (QID) | ORAL | Status: DC

## 2013-05-31 NOTE — Lactation Note (Signed)
This note was copied from the chart of Julie Bonnell Helbling. Lactation Consultation Note Follow up consult.  Baby Julie 84 hours old.  Mother states her nipples are sore and has had some discomfort with the last 2 feedings.  Provided mother with comfort gels. Assisted mother with football position reviewing basics of deep wide latch.  Some clicking heard.  Checked suck and baby is doing some tongue humping and biting.  Taught mother how to do some suck training. Relatched with deep wide close latch no clicking was heard.  Mother acknowledges she has been taught hand expression and did teach back and expressed small amount of colostrum.  Reviewed positioning, waking techniques, cue based feeding, comfort gels care, lactation support services.  Encouraged mother to call for assistance with next feeding. Patient Name: Julie [name] [id number] Date: 05/31/2013 Reason for consult: Initial assessment   Maternal Data Does the patient have breastfeeding experience prior to this delivery?: No  Feeding Feeding Type: Breast Fed Length of feed: 20 min  LATCH Score/Interventions Latch: Repeated attempts needed to sustain latch, nipple held in mouth throughout feeding, stimulation needed to elicit sucking reflex. Intervention(s): Adjust position;Assist with latch;Breast massage  Audible Swallowing: A few with stimulation Intervention(s): Skin to skin  Type of Nipple: Everted at rest and after stimulation  Comfort (Breast/Nipple): Filling, red/small blisters or bruises, mild/mod discomfort  Problem noted: Mild/Moderate discomfort Interventions (Mild/moderate discomfort): Comfort gels  Hold (Positioning): Assistance needed to correctly position infant at breast and maintain latch.  LATCH Score: 6  Lactation Tools Discussed/Used     Consult Status Consult Status: Follow-up Date: 05/31/13 Follow-up type: In-patient    Dahlia Byes [facility] 05/31/2013, 12:05 PM

## 2013-05-31 NOTE — Discharge Summary (Signed)
Obstetric Discharge Summary  Reason for Admission: onset of labor- SROM at 38.4 weeks Prenatal Procedures: none Intrapartum Procedures: spontaneous vaginal delivery and GBS prophylaxis / epidural Postpartum Procedures: none Complications-Operative and Postpartum: 2nd degree perineal laceration Hemoglobin  Date Value Range Status  05/30/2013 10.3* 12.0 - 15.0 g/dL Final     HCT  Date Value Range Status  05/30/2013 30.6* 36.0 - 46.0 % Final    Physical Exam:  General: alert, cooperative and no distress Lochia: appropriate Uterine Fundus: firm Incision: healing well DVT Evaluation: No evidence of DVT seen on physical exam.  Discharge Diagnoses: Term Pregnancy-delivered and Mild IDA of pregnancy-delivered  Discharge Information: Date: 05/31/2013 Activity: pelvic rest Diet: routine Medications: PNV, Ibuprofen and Percocet Condition: stable Instructions: refer to practice specific booklet Discharge to: home Follow-up Information   Follow up with [name],[name], MD. Schedule an appointment as soon as possible for a visit in 6 weeks.   Specialty:  Obstetrics and Gynecology   Contact information:   [address] [place] [address] [phone]       Newborn Data: Live born female  Birth Weight: 5 lb 12.8 oz (2631 g) APGAR: 8, 9  Home with mother.  Julie [name] 05/31/2013, 10:19 AM

## 2013-05-31 NOTE — Progress Notes (Signed)
PPD 2 SVD  S:  Reports feeling well             Tolerating po/ No nausea or vomiting             Bleeding is light             Pain controlled with motrin and percocet             Up ad lib / ambulatory / voiding QS  Newborn breast feeding  / circumcision planned  O:               VS: BP 108/67  Pulse 69  Temp(Src) 97.4 F (36.3 C) (Oral)  Resp 18  Ht 5\' 3"  (1.6 m)  Wt 70.308 kg (155 lb)  BMI 27.46 kg/m2  SpO2 97%  LMP 09/02/2012   LABS:              Recent Labs  05/29/13 1900 05/30/13 0552  WBC 15.6* 24.9*  HGB 11.9* 10.3*  PLT 381 310               Blood type: A/POS/-- (07/22 1624)  Rubella: 1.31 (07/22 1624)                     Physical Exam:             Alert and oriented X3  Abdomen: soft, non-tender, non-distended              Fundus: firm, non-tender, U-1  Perineum: mild edema  Lochia: light  Extremities: no edema, no calf pain or tenderness    A: PPD # 2             Mild IDA    Doing well - stable status  P: Routine post partum orders  DC home  Marlinda Mike CNM, MSN, [facility] 05/31/2013, 10:16 AM

## 2014-04-04 ENCOUNTER — Encounter (HOSPITAL_COMMUNITY): Payer: Self-pay | Admitting: Obstetrics and Gynecology

## 2016-08-07 ENCOUNTER — Encounter (HOSPITAL_COMMUNITY): Payer: Self-pay | Admitting: *Deleted

## 2016-08-14 ENCOUNTER — Other Ambulatory Visit: Payer: Self-pay | Admitting: Obstetrics & Gynecology

## 2016-08-20 ENCOUNTER — Encounter (HOSPITAL_COMMUNITY): Payer: Self-pay | Admitting: Anesthesiology

## 2016-08-20 ENCOUNTER — Ambulatory Visit (HOSPITAL_COMMUNITY): Payer: Managed Care, Other (non HMO) | Admitting: Anesthesiology

## 2016-08-20 ENCOUNTER — Encounter (HOSPITAL_COMMUNITY)
Admission: RE | Disposition: A | Discharge status: Home / Self Care | Payer: Self-pay | Source: Ambulatory Visit | Attending: Obstetrics & Gynecology

## 2016-08-20 ENCOUNTER — Ambulatory Visit (HOSPITAL_COMMUNITY)
Admission: RE | Admit: 2016-08-20 | Discharge: 2016-08-20 | Disposition: A | Discharge status: Home / Self Care | Payer: Managed Care, Other (non HMO) | Source: Ambulatory Visit | Attending: Obstetrics & Gynecology | Admitting: Obstetrics & Gynecology

## 2016-08-20 DIAGNOSIS — Z332 Encounter for elective termination of pregnancy: Secondary | ICD-10-CM | POA: Insufficient documentation

## 2016-08-20 DIAGNOSIS — Z640 Problems related to unwanted pregnancy: Secondary | ICD-10-CM | POA: Diagnosis not present

## 2016-08-20 DIAGNOSIS — Z3A12 12 weeks gestation of pregnancy: Secondary | ICD-10-CM | POA: Diagnosis not present

## 2016-08-20 DIAGNOSIS — Z87891 Personal history of nicotine dependence: Secondary | ICD-10-CM | POA: Diagnosis not present

## 2016-08-20 HISTORY — PX: DILATION AND EVACUATION: SHX1459

## 2016-08-20 HISTORY — DX: Headache, unspecified: R51.9

## 2016-08-20 HISTORY — DX: Headache: R51

## 2016-08-20 LAB — ABO/RH: ABO/RH(D): A POS

## 2016-08-20 LAB — CBC
HCT: 36.9 % (ref 36.0–46.0)
Hemoglobin: 12.8 g/dL (ref 12.0–15.0)
MCH: 30.9 pg (ref 26.0–34.0)
MCHC: 34.7 g/dL (ref 30.0–36.0)
MCV: 89.1 fL (ref 78.0–100.0)
Platelets: 257 10*3/uL (ref 150–400)
RBC: 4.14 MIL/uL (ref 3.87–5.11)
RDW: 12.9 % (ref 11.5–15.5)
WBC: 8 10*3/uL (ref 4.0–10.5)

## 2016-08-20 LAB — TYPE AND SCREEN
ABO/RH(D): A POS
ANTIBODY SCREEN: NEGATIVE

## 2016-08-20 SURGERY — DILATION AND EVACUATION, UTERUS
Anesthesia: Monitor Anesthesia Care | Site: Vagina

## 2016-08-20 MED ORDER — OXYCODONE-ACETAMINOPHEN 7.5-325 MG PO TABS: 1.0000 | ORAL_TABLET | ORAL | 0 refills | Status: DC | PRN

## 2016-08-20 MED ORDER — MIDAZOLAM HCL 2 MG/2ML IJ SOLN
INTRAMUSCULAR | Status: AC
  Filled 2016-08-20: qty 2

## 2016-08-20 MED ORDER — LIDOCAINE HCL 1 % IJ SOLN
INTRAMUSCULAR | Status: DC | PRN
  Administered 2016-08-20: 20 mL

## 2016-08-20 MED ORDER — KETOROLAC TROMETHAMINE 30 MG/ML IJ SOLN
INTRAMUSCULAR | Status: AC
Start: 2016-08-20 — End: 2016-08-20
  Filled 2016-08-20: qty 1

## 2016-08-20 MED ORDER — FENTANYL CITRATE (PF) 100 MCG/2ML IJ SOLN: 25.0000 ug | INTRAMUSCULAR | Status: DC | PRN

## 2016-08-20 MED ORDER — MEPERIDINE HCL 25 MG/ML IJ SOLN: 6.2500 mg | INTRAMUSCULAR | Status: DC | PRN

## 2016-08-20 MED ORDER — LIDOCAINE HCL (CARDIAC) 20 MG/ML IV SOLN
INTRAVENOUS | Status: AC
  Filled 2016-08-20: qty 5

## 2016-08-20 MED ORDER — KETOROLAC TROMETHAMINE 30 MG/ML IJ SOLN
INTRAMUSCULAR | Status: DC | PRN
  Administered 2016-08-20: 30 mg via INTRAVENOUS

## 2016-08-20 MED ORDER — SILVER NITRATE-POT NITRATE 75-25 % EX MISC
CUTANEOUS | Status: DC | PRN
  Administered 2016-08-20: 2

## 2016-08-20 MED ORDER — LACTATED RINGERS IV SOLN: INTRAVENOUS | Status: DC

## 2016-08-20 MED ORDER — LACTATED RINGERS IV SOLN
INTRAVENOUS | Status: DC
  Administered 2016-08-20: 12:00:00 via INTRAVENOUS

## 2016-08-20 MED ORDER — SCOPOLAMINE 1 MG/3DAYS TD PT72
1.0000 | MEDICATED_PATCH | Freq: Once | TRANSDERMAL | Status: DC
  Administered 2016-08-20: 1.5 mg via TRANSDERMAL

## 2016-08-20 MED ORDER — PROPOFOL 500 MG/50ML IV EMUL
INTRAVENOUS | Status: DC | PRN
  Administered 2016-08-20: 30 mg via INTRAVENOUS
  Administered 2016-08-20 (×2): 20 mg via INTRAVENOUS
  Administered 2016-08-20: 10 mg via INTRAVENOUS
  Administered 2016-08-20: 20 mg via INTRAVENOUS
  Administered 2016-08-20: 30 mg via INTRAVENOUS
  Administered 2016-08-20: 40 mg via INTRAVENOUS

## 2016-08-20 MED ORDER — ONDANSETRON HCL 4 MG/2ML IJ SOLN
INTRAMUSCULAR | Status: DC | PRN
  Administered 2016-08-20: 4 mg via INTRAVENOUS

## 2016-08-20 MED ORDER — FENTANYL CITRATE (PF) 100 MCG/2ML IJ SOLN
INTRAMUSCULAR | Status: AC
  Filled 2016-08-20: qty 2

## 2016-08-20 MED ORDER — PROPOFOL 10 MG/ML IV BOLUS
INTRAVENOUS | Status: AC
  Filled 2016-08-20: qty 40

## 2016-08-20 MED ORDER — CEFAZOLIN SODIUM-DEXTROSE 2-4 GM/100ML-% IV SOLN
2.0000 g | INTRAVENOUS | Status: AC
  Administered 2016-08-20: 2 g via INTRAVENOUS

## 2016-08-20 MED ORDER — METOCLOPRAMIDE HCL 5 MG/ML IJ SOLN: 10.0000 mg | Freq: Once | INTRAMUSCULAR | Status: DC | PRN

## 2016-08-20 MED ORDER — LIDOCAINE HCL (CARDIAC) 20 MG/ML IV SOLN
INTRAVENOUS | Status: DC | PRN
  Administered 2016-08-20: 30 mg via INTRAVENOUS
  Administered 2016-08-20: 70 mg via INTRAVENOUS

## 2016-08-20 MED ORDER — SCOPOLAMINE 1 MG/3DAYS TD PT72
MEDICATED_PATCH | TRANSDERMAL | Status: AC
  Administered 2016-08-20: 1.5 mg via TRANSDERMAL
  Filled 2016-08-20: qty 1

## 2016-08-20 MED ORDER — LIDOCAINE HCL 1 % IJ SOLN
INTRAMUSCULAR | Status: AC
  Filled 2016-08-20: qty 20

## 2016-08-20 MED ORDER — DEXAMETHASONE SODIUM PHOSPHATE 10 MG/ML IJ SOLN
INTRAMUSCULAR | Status: DC | PRN
  Administered 2016-08-20: 4 mg via INTRAVENOUS

## 2016-08-20 MED ORDER — FENTANYL CITRATE (PF) 100 MCG/2ML IJ SOLN
INTRAMUSCULAR | Status: DC | PRN
  Administered 2016-08-20 (×2): 50 ug via INTRAVENOUS

## 2016-08-20 MED ORDER — ONDANSETRON HCL 4 MG/2ML IJ SOLN
INTRAMUSCULAR | Status: AC
  Filled 2016-08-20: qty 2

## 2016-08-20 MED ORDER — DEXAMETHASONE SODIUM PHOSPHATE 4 MG/ML IJ SOLN
INTRAMUSCULAR | Status: AC
  Filled 2016-08-20: qty 1

## 2016-08-20 MED ORDER — MIDAZOLAM HCL 2 MG/2ML IJ SOLN
INTRAMUSCULAR | Status: DC | PRN
  Administered 2016-08-20: 2 mg via INTRAVENOUS

## 2016-08-20 SURGICAL SUPPLY — 20 items
CATH FOLEY LATEX FREE 14FR (CATHETERS) ×1
CATH FOLEY LF 14FR (CATHETERS) ×1 IMPLANT
CATH ROBINSON RED A/P 16FR (CATHETERS) IMPLANT
CLOTH BEACON ORANGE TIMEOUT ST (SAFETY) ×2 IMPLANT
DECANTER SPIKE VIAL GLASS SM (MISCELLANEOUS) ×2 IMPLANT
GLOVE BIO SURGEON STRL SZ 6.5 (GLOVE) ×2 IMPLANT
GLOVE BIOGEL PI IND STRL 7.0 (GLOVE) ×2 IMPLANT
GLOVE BIOGEL PI INDICATOR 7.0 (GLOVE) ×2
GOWN STRL REUS W/TWL LRG LVL3 (GOWN DISPOSABLE) ×4 IMPLANT
KIT BERKELEY 1ST TRIMESTER 3/8 (MISCELLANEOUS) ×2 IMPLANT
PACK VAGINAL MINOR WOMEN LF (CUSTOM PROCEDURE TRAY) ×2 IMPLANT
PAD OB MATERNITY 4.3X12.25 (PERSONAL CARE ITEMS) ×2 IMPLANT
PAD PREP 24X48 CUFFED NSTRL (MISCELLANEOUS) ×2 IMPLANT
SET BERKELEY SUCTION TUBING (SUCTIONS) ×2 IMPLANT
TOWEL OR 17X24 6PK STRL BLUE (TOWEL DISPOSABLE) ×4 IMPLANT
VACURETTE 10 RIGID CVD (CANNULA) IMPLANT
VACURETTE 12 RIGID CVD (CANNULA) ×2 IMPLANT
VACURETTE 7MM CVD STRL WRAP (CANNULA) IMPLANT
VACURETTE 8 RIGID CVD (CANNULA) IMPLANT
VACURETTE 9 RIGID CVD (CANNULA) IMPLANT

## 2016-08-20 NOTE — Anesthesia Preprocedure Evaluation (Signed)
Anesthesia Evaluation  Patient identified by MRN, date of birth, ID band Patient awake    Reviewed: Allergy & Precautions, NPO status , Patient's Chart, lab work & pertinent test results  Airway Mallampati: II  TM Distance: >3 FB Neck ROM: Full    Dental no notable dental hx.    Pulmonary neg pulmonary ROS, former smoker,    Pulmonary exam normal breath sounds clear to auscultation       Cardiovascular negative cardio ROS Normal cardiovascular exam Rhythm:Regular Rate:Normal     Neuro/Psych negative neurological ROS  negative psych ROS   GI/Hepatic negative GI ROS, Neg liver ROS,   Endo/Other  negative endocrine ROS  Renal/GU negative Renal ROS  negative genitourinary   Musculoskeletal negative musculoskeletal ROS (+)   Abdominal   Peds negative pediatric ROS (+)  Hematology negative hematology ROS (+)   Anesthesia Other Findings   Reproductive/Obstetrics negative OB ROS                             Anesthesia Physical Anesthesia Plan  ASA: II  Anesthesia Plan: MAC   Post-op Pain Management:    Induction: Intravenous  Airway Management Planned: Simple Face Mask  Additional Equipment:   Intra-op Plan:   Post-operative Plan:   Informed Consent: I have reviewed the patients History and Physical, chart, labs and discussed the procedure including the risks, benefits and alternatives for the proposed anesthesia with the patient or authorized representative who has indicated his/her understanding and acceptance.   Dental advisory given  Plan Discussed with: CRNA  Anesthesia Plan Comments:        Anesthesia Quick Evaluation  

## 2016-08-20 NOTE — Discharge Instructions (Addendum)
DISCHARGE INSTRUCTIONS: D&C / D&E °The following instructions have been prepared to help you care for yourself upon your return home. °  °Personal hygiene: °• Use sanitary pads for vaginal drainage, not tampons. °• Shower the day after your procedure. °• NO tub baths, pools or Jacuzzis for 2-3 weeks. °• Wipe front to back after using the bathroom. ° °Activity and limitations: °• Do NOT drive or operate any equipment for 24 hours. The effects of anesthesia are still present and drowsiness may result. °• Do NOT rest in bed all day. °• Walking is encouraged. °• Walk up and down stairs slowly. °• You may resume your normal activity in one to two days or as indicated by your physician. ° °Sexual activity: NO intercourse for at least 2 weeks after the procedure, or as indicated by your physician. ° °Diet: Eat a light meal as desired this evening. You may resume your usual diet tomorrow. ° °Return to work: You may resume your work activities in one to two days or as indicated by your doctor. ° °What to expect after your surgery: Expect to have vaginal bleeding/discharge for 2-3 days and spotting for up to 10 days. It is not unusual to have soreness for up to 1-2 weeks. You may have a slight burning sensation when you urinate for the first day. Mild cramps may continue for a couple of days. You may have a regular period in 2-6 weeks. ° °Call your doctor for any of the following: °• Excessive vaginal bleeding, saturating and changing one pad every hour. °• Inability to urinate 6 hours after discharge from hospital. °• Pain not relieved by pain medication. °• Fever of 100.4° F or greater. °• Unusual vaginal discharge or odor. ° ° Call for an appointment:  ° ° °Patient’s signature: ______________________ ° °Nurse’s signature ________________________ ° °Support person's signature_______________________ ° ° °Dilation and Curettage or Vacuum Curettage, Care After °This sheet gives you information about how to care for yourself  after your procedure. Your health care provider may also give you more specific instructions. If you have problems or questions, contact your health care provider. °What can I expect after the procedure? °After your procedure, it is common to have: °· Mild pain or cramping. °· Some vaginal bleeding or spotting. °These may last for up to 2 weeks after your procedure. °Follow these instructions at home: °Activity  ° °· Do not drive or use heavy machinery while taking prescription pain medicine. °· Avoid driving for the first 24 hours after your procedure. °· Take frequent, short walks, followed by rest periods, throughout the day. Ask your health care provider what activities are safe for you. After 1-2 days, you may be able to return to your normal activities. °· Do not lift anything heavier than 10 lb (4.5 kg) until your health care provider approves. °· For at least 2 weeks, or as long as told by your health care provider, do not: °¨ Douche. °¨ Use tampons. °¨ Have sexual intercourse. °General instructions  ° °· Take over-the-counter and prescription medicines only as told by your health care provider. This is especially important if you take blood thinning medicine. °· Do not take baths, swim, or use a hot tub until your health care provider approves. Take showers instead of baths. °· Wear compression stockings as told by your health care provider. These stockings help to prevent blood clots and reduce swelling in your legs. °· It is your responsibility to get the results of your procedure. Ask your   health care provider, or the department performing the procedure, when your results will be ready. °· Keep all follow-up visits as told by your health care provider. This is important. °Contact a health care provider if: °· You have severe cramps that get worse or that do not get better with medicine. °· You have severe abdominal pain. °· You cannot drink fluids without vomiting. °· You develop pain in a different area  of your pelvis. °· You have bad-smelling vaginal discharge. °· You have a rash. °Get help right away if: °· You have vaginal bleeding that soaks more than one sanitary pad in 1 hour, for 2 hours in a row. °· You pass large blood clots from your vagina. °· You have a fever that is above 100.4°F (38.0°C). °· Your abdomen feels very tender or hard. °· You have chest pain. °· You have shortness of breath. °· You cough up blood. °· You feel dizzy or light-headed. °· You faint. °· You have pain in your neck or shoulder area. °This information is not intended to replace advice given to you by your health care provider. Make sure you discuss any questions you have with your health care provider. °Document Released: 05/17/2000 Document Revised: 01/17/2016 Document Reviewed: 12/21/2015 °Elsevier Interactive Patient Education © 2017 [employer] Inc[employer] ° °

## 2016-08-20 NOTE — Discharge Summary (Signed)
Physician Discharge Summary  Patient ID: Julie [name] MRN: [id number] DOB/AGE: 24/08/1990 24 y.o.  Admit date: 08/20/2016 Discharge date: 08/20/2016  Admission Diagnoses: Undesired Pregnancy  Discharge Diagnoses: Undesired Pregnancy        Active Problems:   * No active hospital problems. *   Discharged Condition: good  Hospital Course: Outpatient  Consults: None  Treatments: surgery: D+E suction  Disposition: 01-Home or Self Care   Allergies as of 08/20/2016      Reactions   Latex Hives, Itching, Swelling, Rash      Medication List    TAKE these medications   ibuprofen 200 MG tablet Commonly known as:  ADVIL,MOTRIN Take 800 mg by mouth every 6 (six) hours as needed for headache or moderate pain.   oxyCODONE-acetaminophen 7.5-325 MG tablet Commonly known as:  PERCOCET Take 1 tablet by mouth every 4 (four) hours as needed for severe pain.      Follow-up Information    Genia DelMarie-Lyne Monque Haggar, MD Follow up in 3 week(s).   Specialty:  Obstetrics and Gynecology Why:  [facility] at [address] Contact information: [address] [place] [id number] [phone]           Signed: Genia DelMarie-Lyne Josefita Weissmann, MD 08/20/2016, 1:37 PM

## 2016-08-20 NOTE — Op Note (Signed)
08/20/2016  1:26 PM  PATIENT:  Julie [name]  26 y.o. female  PRE-OPERATIVE DIAGNOSIS:  Undesired Pregnancy at 12+ wks  POST-OPERATIVE DIAGNOSIS:  undesired pregnancy at 12+ wks  PROCEDURE:  Procedure(s): DILATATION AND EVACUATION  SURGEON:  Surgeon(s): Genia DelMarie-Lyne Shmiel Morton, MD  ASSISTANTS: none   ANESTHESIA:   IV sedation   PROCEDURE:  Under IV sedation the patient is in lithotomy position. She is prepped with Betadine on the suprapubic, vulvar and vaginal areas and draped as usual. The bladder is catheterized. The vaginal exam reveals an anteverted uterus corresponding to about 12 weeks' no adnexal mass. The speculum is inserted in the vagina and the anterior lip of the cervix is grasped with a tenaculum. A paracervical block is done with  Xylocaine 1% a total of 20 cc at 4 and 8:00. Dilation of the cervix with [place] dilators up to #37-39 without problem.  A curved suction curette #12 is inserted in the intrauterine cavity.  Products of conception corresponding to about [redacted] weeks gestation are suctioned.  The suction curette is then removed and we used a sharp curet to gently curettage all intrauterine surfaces.  The suction curette is reinserted in the intrauterine cavity to remove any products of conception or blood clots.  Minimal tissue is removed and the uterus contracts well on the instrument. After removing the suction curette we confirm minimal bleeding.  The tenaculum is removed. Silver nitrate is used to control bleeding on the anterior lip of the cervix. We then removed the speculum. The patient is brought to recovery room in good and stable status. The specimen of products of conception is sent to pathology. The patient is Rh+.  ESTIMATED BLOOD LOSS: 35 cc   Intake/Output Summary (Last 24 hours) at 08/20/16 1326 Last data filed at 08/20/16 1315  Gross per 24 hour  Intake              700 ml  Output              140 ml  Net              560 ml     BLOOD ADMINISTERED:none    LOCAL MEDICATIONS USED:  XYLOCAINE 1% 20 cc Paracervical Block  SPECIMEN:  Source of Specimen:  Products of Conception  DISPOSITION OF SPECIMEN:  PATHOLOGY  COUNTS:  YES  PLAN OF CARE: Transfer to PACU  Genia DelMarie-Lyne Dois Juarbe MD  08/20/2016 at 1:27 pm

## 2016-08-20 NOTE — Transfer of Care (Signed)
Immediate Anesthesia Transfer of Care Note  Patient: Julie [name]  Procedure(s) Performed: Procedure(s): DILATATION AND EVACUATION (N/A)  Patient Location: PACU  Anesthesia Type:MAC  Level of Consciousness: awake, alert , sedated and patient cooperative  Airway & Oxygen Therapy: Patient Spontanous Breathing and Patient connected to nasal cannula oxygen  Post-op Assessment: Report given to RN and Post -op Vital signs reviewed and stable  Post vital signs: Reviewed and stable  Last Vitals:  Vitals:   08/20/16 1122  BP: 111/70  Pulse: 83  Resp: 18  Temp: 36.7 C    Last Pain:  Vitals:   08/20/16 1122  TempSrc: Oral      Patients Stated Pain Goal: 7 (23/53/61 4431)  Complications: No apparent anesthesia complications

## 2016-08-20 NOTE — H&P (Signed)
Julie [name] is an 26 y.o. female G2P1001  RP:  Unwanted pregnancy first trimester  Pertinent Gynecological History:  Contraception:  None Blood transfusions: none Sexually transmitted diseases: no past history Previous GYN Procedures: None Last pap: normal  OB History: G2P1001   Menstrual History:  No LMP recorded.    Past Medical History:  Diagnosis Date  . Headache    MIGRAINES  . Medical history non-contributory   . Postpartum care following vaginal delivery (12/27) 05/30/2013    Past Surgical History:  Procedure Laterality Date  . NO PAST SURGERIES      Family History  Problem Relation Age of Onset  . Asthma Neg Hx   . Diabetes Neg Hx   . Depression Neg Hx   . Heart disease Neg Hx   . Hyperlipidemia Neg Hx     Social History:  reports that she quit smoking about 3 years ago. Her smoking use included Cigarettes. She has never used smokeless tobacco. She reports that she does not drink alcohol or use drugs.  Allergies:  Allergies  Allergen Reactions  . Latex Hives, Itching, Swelling and Rash    Prescriptions Prior to Admission  Medication Sig Dispense Refill Last Dose  . ibuprofen (ADVIL,MOTRIN) 200 MG tablet Take 800 mg by mouth every 6 (six) hours as needed for headache or moderate pain.   Unknown at Unknown time    ROS  Blood pressure 111/70, pulse 83, temperature 98.1 F (36.7 C), temperature source Oral, resp. rate 18, height 5\' 3"  (1.6 m), weight 125 lb (56.7 kg), SpO2 100 %, unknown if currently breastfeeding. Physical Exam   US sIUP FHR pos.  Results for orders placed or performed during the hospital encounter of 08/20/16 (from the past 24 hour(s))  CBC     Status: None   Collection Time: 08/20/16 11:16 AM  Result Value Ref Range   WBC 8.0 4.0 - 10.5 K/uL   RBC 4.14 3.87 - 5.11 MIL/uL   Hemoglobin 12.8 12.0 - 15.0 g/dL   HCT 40.936.9 81.136.0 - 91.446.0 %   MCV 89.1 78.0 - 100.0 fL   MCH 30.9 26.0 - 34.0 pg   MCHC 34.7 30.0 - 36.0 g/dL   RDW 78.212.9  95.611.5 - 21.315.5 %   Platelets 257 150 - 400 K/uL  Type and screen     Status: None   Collection Time: 08/20/16 11:16 AM  Result Value Ref Range   ABO/RH(D) A POS    Antibody Screen NEG    Sample Expiration 08/23/2016     Assessment/Plan: 12+ wk unwanted pregnancy for D+E suction.  Surgery/risks reviewed.  Rh pos.  [name] Lesslie Mossa 08/20/2016, 12:45 PM

## 2016-08-21 ENCOUNTER — Encounter (HOSPITAL_COMMUNITY): Payer: Self-pay | Admitting: Obstetrics & Gynecology

## 2016-08-21 NOTE — Anesthesia Postprocedure Evaluation (Signed)
Anesthesia Post Note  Patient: Julie [name]  Procedure(s) Performed: Procedure(s) (LRB): DILATATION AND EVACUATION (N/A)  Patient location during evaluation: PACU Anesthesia Type: MAC Level of consciousness: awake and alert Pain management: pain level controlled Vital Signs Assessment: post-procedure vital signs reviewed and stable Respiratory status: spontaneous breathing, nonlabored ventilation, respiratory function stable and patient connected to nasal cannula oxygen Cardiovascular status: stable and blood pressure returned to baseline Anesthetic complications: no       Last Vitals:  Vitals:   08/20/16 1407 08/20/16 1435  BP:  113/65  Pulse: 74 62  Resp: 12 16  Temp: 37 C     Last Pain:  Vitals:   08/20/16 1407  TempSrc: Oral                 Montez Hageman

## 2016-10-01 ENCOUNTER — Ambulatory Visit (INDEPENDENT_AMBULATORY_CARE_PROVIDER_SITE_OTHER): Payer: 59 | Admitting: Obstetrics & Gynecology

## 2016-10-01 ENCOUNTER — Encounter: Payer: Self-pay | Admitting: Obstetrics & Gynecology

## 2016-10-01 VITALS — BP 132/70

## 2016-10-01 DIAGNOSIS — Z30016 Encounter for initial prescription of transdermal patch hormonal contraceptive device: Secondary | ICD-10-CM

## 2016-10-01 DIAGNOSIS — N921 Excessive and frequent menstruation with irregular cycle: Secondary | ICD-10-CM | POA: Diagnosis not present

## 2016-10-01 MED ORDER — NORELGESTROMIN-ETH ESTRADIOL 150-35 MCG/24HR TD PTWK: 1.0000 | MEDICATED_PATCH | TRANSDERMAL | 4 refills | Status: DC

## 2016-10-01 NOTE — Progress Notes (Signed)
    Catricia Scheerer 12-Apr-1991 [id number]        26 y.o.  G1P1001   RP:  Heavy 1st period post D+E  Had D+E 08/21/2106 for unwanted pregnancy at 12+ wks.  No Cx.  Irregular spotting x 2-3 wks, then a heavy period x 2 days about 2 wks ago.  No vaginal bleeding currently.  No pelvic pain.  No abnormal d/c.  No fever.  Past medical history,surgical history, problem list, medications, allergies, family history and social history were all reviewed and documented in the EPIC chart.  Directed ROS with pertinent positives and negatives documented in the history of present illness/assessment and plan.  Exam:  Vitals:   10/01/16 1134  BP: 132/70   General appearance:  Normal  Gyn exam:  Vulva normal.  Uterus normal volume, AV, mobile, Non-tender.  No adnexal mass.  Normal secretions.  Assessment/Plan:  26 y.o. G1P1001    1.  Menorrhagia with irregular cycle D+E 12+ wks 3/202018.  No Cx.  Patho POC.  1st Menstrual period heavy, but now resolved.  Normal Gyn exam today, no sign of Endometritis or Retention.  Will start on Xulane patch with next menses.  Annual/Gyn exam within next 2 months.  Call back if heavy menses persist.  2. Encounter for initial prescription of transdermal patch hormonal contraceptive device Used strict condoms since D+E.  Contraceptive counseling done.  BCPs/Nuvaring and Patch reviewed.  Declines IUD or Nexplanon.  Prefers Xulane patch.  Usage/risks/benefits reviewed.  Counseling >50% x 15 min on above issues.  Genia Del MD, 11:50 AM 10/01/2016

## 2016-10-01 NOTE — Patient Instructions (Signed)
1.  Menorrhagia with irregular cycle D+E 12+ wks 3/202018.  No Cx.  Patho POC.  1st Menstrual period heavy, but now resolved.  Normal Gyn exam today, no sign of Endometritis or Retention.  Will start on Xulane patch with next menses.  Annual/Gyn exam within next 2 months.  Call back if heavy menses persist.  2. Encounter for initial prescription of transdermal patch hormonal contraceptive device Used strict condoms since D+E.  Contraceptive counseling done.  BCPs/Nuvaring and Patch reviewed.  Declines IUD or Nexplanon.  Prefers Xulane patch.  Usage/risks/benefits reviewed.  Julie [name], it was a pleasure to see you today!

## 2016-12-23 ENCOUNTER — Encounter: Payer: Self-pay | Admitting: Obstetrics & Gynecology

## 2016-12-23 ENCOUNTER — Ambulatory Visit (INDEPENDENT_AMBULATORY_CARE_PROVIDER_SITE_OTHER): Payer: 59 | Admitting: Obstetrics & Gynecology

## 2016-12-23 VITALS — BP 110/70 | Ht 63.0 in | Wt 125.0 lb

## 2016-12-23 DIAGNOSIS — Z3045 Encounter for surveillance of transdermal patch hormonal contraceptive device: Secondary | ICD-10-CM | POA: Diagnosis not present

## 2016-12-23 DIAGNOSIS — Z113 Encounter for screening for infections with a predominantly sexual mode of transmission: Secondary | ICD-10-CM

## 2016-12-23 DIAGNOSIS — Z01419 Encounter for gynecological examination (general) (routine) without abnormal findings: Secondary | ICD-10-CM | POA: Diagnosis not present

## 2016-12-23 LAB — HIV ANTIBODY (ROUTINE TESTING W REFLEX): HIV 1&2 Ab, 4th Generation: NONREACTIVE

## 2016-12-23 NOTE — Progress Notes (Signed)
Julie [name] 04/21/1991 [id number]   History:    26 y.o.  G2P1A1  Engaged  RP:  Established patient presenting for annual gyn exam   HPI:  Well on Xulane with light menses, no pelvic pain.  Started Xulane 10/2016 post TAB, no Cx in 08/2016.  Breasts wnl.  Past medical history,surgical history, family history and social history were all reviewed and documented in the EPIC chart.  Gynecologic History Patient's last menstrual period was 12/09/2016. Contraception: Ortho-Evra patches weekly Last Pap: 2017. Results were: normal Last mammogram: Never  Obstetric History OB History  Gravida Para Term Preterm AB Living  1 1 1     1   SAB TAB Ectopic Multiple Live Births          1    # Outcome Date GA Lbr Len/2nd Weight Sex Delivery Anes PTL Lv  1 Term 05/30/13 [id number] 02:52 / 00:35 5 lb 12.8 oz (2.631 kg) M Vag-Spont EPI  LIV     Birth Comments: wnl       ROS: A ROS was performed and pertinent positives and negatives are included in the history.  GENERAL: No fevers or chills. HEENT: No change in vision, no earache, sore throat or sinus congestion. NECK: No pain or stiffness. CARDIOVASCULAR: No chest pain or pressure. No palpitations. PULMONARY: No shortness of breath, cough or wheeze. GASTROINTESTINAL: No abdominal pain, nausea, vomiting or diarrhea, melena or bright red blood per rectum. GENITOURINARY: No urinary frequency, urgency, hesitancy or dysuria. MUSCULOSKELETAL: No joint or muscle pain, no back pain, no recent trauma. DERMATOLOGIC: No rash, no itching, no lesions. ENDOCRINE: No polyuria, polydipsia, no heat or cold intolerance. No recent change in weight. HEMATOLOGICAL: No anemia or easy bruising or bleeding. NEUROLOGIC: No headache, seizures, numbness, tingling or weakness. PSYCHIATRIC: No depression, no loss of interest in normal activity or change in sleep pattern.     Exam:   BP 110/70   Ht 5\' 3"  (1.6 m)   Wt 125 lb (56.7 kg)   LMP 12/09/2016   BMI 22.14 kg/m   Body  mass index is 22.14 kg/m.  General appearance : Well developed well nourished female. No acute distress HEENT: Eyes: no retinal hemorrhage or exudates,  Neck supple, trachea midline, no carotid bruits, no thyroidmegaly Lungs: Clear to auscultation, no rhonchi or wheezes, or rib retractions  Heart: Regular rate and rhythm, no murmurs or gallops Breast:Examined in sitting and supine position were symmetrical in appearance, no palpable masses or tenderness,  no skin retraction, no nipple inversion, no nipple discharge, no skin discoloration, no axillary or supraclavicular lymphadenopathy Abdomen: no palpable masses or tenderness, no rebound or guarding Extremities: no edema or skin discoloration or tenderness  Pelvic:  Bartholin, Urethra, Skene Glands: Within normal limits             Vagina: No gross lesions or discharge  Cervix: No gross lesions or discharge.  Pap reflex/Gono-chlam done.  Uterus  AV, normal size, shape and consistency, non-tender and mobile  Adnexa  Without masses or tenderness  Anus and perineum  normal    Assessment/Plan:  26 y.o. female for annual exam   1. Encounter for routine gynecological examination with Papanicolaou smear of cervix Normal Gyn exam.  Pap reflex done.  Breast exam normal.  2. Encounter for surveillance of transdermal patch hormonal contraceptive device Will continue with Xulane.  Well tolerated, good compliance.  3. Screen for STD (sexually transmitted disease) Gono-Chlam done on Pap. - HIV antibody - Hepatitis C  Antibody - Hepatitis B Surface AntiGEN - RPR  Genia Del MD, 8:49 AM 12/23/2016

## 2016-12-23 NOTE — Addendum Note (Signed)
Addended by: Berna SpareASTILLO, BLANCA A on: 12/23/2016 09:20 AM   Modules accepted: Orders

## 2016-12-23 NOTE — Patient Instructions (Signed)
1. Encounter for routine gynecological examination with Papanicolaou smear of cervix Normal Gyn exam.  Pap reflex done.  Breast exam normal.  2. Encounter for surveillance of transdermal patch hormonal contraceptive device Will continue with Xulane.  Well tolerated, good compliance.  3. Screen for STD (sexually transmitted disease) Gono-Chlam done on Pap. - HIV antibody - Hepatitis C Antibody - Hepatitis B Surface AntiGEN - RPR  Good to see you today Julie [name]!  I will inform you of your results as soon as available.

## 2016-12-24 LAB — RPR

## 2016-12-24 LAB — HEPATITIS C ANTIBODY: HCV Ab: NEGATIVE

## 2016-12-24 LAB — HEPATITIS B SURFACE ANTIGEN: HEP B S AG: NEGATIVE

## 2016-12-25 LAB — PAP IG, CT-NG, RFX HPV ASCU
Chlamydia Probe Amp: NOT DETECTED
GC PROBE AMP: NOT DETECTED

## 2017-12-24 ENCOUNTER — Encounter: Payer: 59 | Admitting: Obstetrics & Gynecology

## 2017-12-24 DIAGNOSIS — Z0289 Encounter for other administrative examinations: Secondary | ICD-10-CM

## 2018-01-19 LAB — OB RESULTS CONSOLE GBS: STREP GROUP B AG: NEGATIVE

## 2018-02-01 ENCOUNTER — Inpatient Hospital Stay (HOSPITAL_COMMUNITY): Payer: 59 | Admitting: Anesthesiology

## 2018-02-01 ENCOUNTER — Inpatient Hospital Stay (HOSPITAL_COMMUNITY)
Admission: AD | Admit: 2018-02-01 | Discharge: 2018-02-03 | DRG: 806 | Disposition: A | Discharge status: Home / Self Care | Payer: 59 | Attending: Obstetrics and Gynecology | Admitting: Obstetrics and Gynecology

## 2018-02-01 ENCOUNTER — Encounter (HOSPITAL_COMMUNITY): Payer: Self-pay

## 2018-02-01 ENCOUNTER — Other Ambulatory Visit: Payer: Self-pay

## 2018-02-01 DIAGNOSIS — Z87891 Personal history of nicotine dependence: Secondary | ICD-10-CM | POA: Diagnosis not present

## 2018-02-01 DIAGNOSIS — O9081 Anemia of the puerperium: Secondary | ICD-10-CM | POA: Diagnosis not present

## 2018-02-01 DIAGNOSIS — D62 Acute posthemorrhagic anemia: Secondary | ICD-10-CM | POA: Diagnosis not present

## 2018-02-01 DIAGNOSIS — Z3483 Encounter for supervision of other normal pregnancy, third trimester: Secondary | ICD-10-CM | POA: Diagnosis present

## 2018-02-01 DIAGNOSIS — Z3A38 38 weeks gestation of pregnancy: Secondary | ICD-10-CM

## 2018-02-01 LAB — TYPE AND SCREEN
ABO/RH(D): A POS
Antibody Screen: NEGATIVE

## 2018-02-01 LAB — CBC
HCT: 34.2 % — ABNORMAL LOW (ref 36.0–46.0)
Hemoglobin: 11.1 g/dL — ABNORMAL LOW (ref 12.0–15.0)
MCH: 28.5 pg (ref 26.0–34.0)
MCHC: 32.5 g/dL (ref 30.0–36.0)
MCV: 87.9 fL (ref 78.0–100.0)
PLATELETS: 320 10*3/uL (ref 150–400)
RBC: 3.89 MIL/uL (ref 3.87–5.11)
RDW: 14.1 % (ref 11.5–15.5)
WBC: 11 10*3/uL — ABNORMAL HIGH (ref 4.0–10.5)

## 2018-02-01 LAB — OB RESULTS CONSOLE GC/CHLAMYDIA
CHLAMYDIA, DNA PROBE: NEGATIVE
Gonorrhea: NEGATIVE

## 2018-02-01 LAB — OB RESULTS CONSOLE HIV ANTIBODY (ROUTINE TESTING): HIV: NONREACTIVE

## 2018-02-01 LAB — OB RESULTS CONSOLE HEPATITIS B SURFACE ANTIGEN: Hepatitis B Surface Ag: NEGATIVE

## 2018-02-01 LAB — OB RESULTS CONSOLE RUBELLA ANTIBODY, IGM: Rubella: IMMUNE

## 2018-02-01 LAB — OB RESULTS CONSOLE RPR: RPR: NONREACTIVE

## 2018-02-01 MED ORDER — DIPHENHYDRAMINE HCL 50 MG/ML IJ SOLN: 12.5000 mg | INTRAMUSCULAR | Status: DC | PRN

## 2018-02-01 MED ORDER — TETANUS-DIPHTH-ACELL PERTUSSIS 5-2.5-18.5 LF-MCG/0.5 IM SUSP: 0.5000 mL | Freq: Once | INTRAMUSCULAR | Status: DC

## 2018-02-01 MED ORDER — EPHEDRINE 5 MG/ML INJ
10.0000 mg | INTRAVENOUS | Status: DC | PRN
  Filled 2018-02-01: qty 2

## 2018-02-01 MED ORDER — OXYTOCIN BOLUS FROM INFUSION
500.0000 mL | Freq: Once | INTRAVENOUS | Status: AC
  Administered 2018-02-01: 500 mL via INTRAVENOUS

## 2018-02-01 MED ORDER — LIDOCAINE HCL (PF) 1 % IJ SOLN
30.0000 mL | INTRAMUSCULAR | Status: DC | PRN
  Filled 2018-02-01: qty 30

## 2018-02-01 MED ORDER — LIDOCAINE HCL (PF) 1 % IJ SOLN
INTRAMUSCULAR | Status: DC | PRN
  Administered 2018-02-01: 5 mL via EPIDURAL

## 2018-02-01 MED ORDER — OXYCODONE-ACETAMINOPHEN 5-325 MG PO TABS: 1.0000 | ORAL_TABLET | ORAL | Status: DC | PRN

## 2018-02-01 MED ORDER — SIMETHICONE 80 MG PO CHEW: 80.0000 mg | CHEWABLE_TABLET | ORAL | Status: DC | PRN

## 2018-02-01 MED ORDER — PRENATAL MULTIVITAMIN CH
1.0000 | ORAL_TABLET | Freq: Every day | ORAL | Status: DC
  Administered 2018-02-02 – 2018-02-03 (×2): 1 via ORAL
  Filled 2018-02-01 (×2): qty 1

## 2018-02-01 MED ORDER — BENZOCAINE-MENTHOL 20-0.5 % EX AERO: 1.0000 "application " | INHALATION_SPRAY | CUTANEOUS | Status: DC | PRN

## 2018-02-01 MED ORDER — LACTATED RINGERS IV SOLN: 500.0000 mL | INTRAVENOUS | Status: DC | PRN

## 2018-02-01 MED ORDER — DIBUCAINE 1 % RE OINT: 1.0000 "application " | TOPICAL_OINTMENT | RECTAL | Status: DC | PRN

## 2018-02-01 MED ORDER — FLEET ENEMA 7-19 GM/118ML RE ENEM: 1.0000 | ENEMA | RECTAL | Status: DC | PRN

## 2018-02-01 MED ORDER — COCONUT OIL OIL: 1.0000 "application " | TOPICAL_OIL | Status: DC | PRN

## 2018-02-01 MED ORDER — FENTANYL 2.5 MCG/ML BUPIVACAINE 1/10 % EPIDURAL INFUSION (WH - ANES)
14.0000 mL/h | INTRAMUSCULAR | Status: DC | PRN
  Administered 2018-02-01: 14 mL/h via EPIDURAL
  Filled 2018-02-01: qty 100

## 2018-02-01 MED ORDER — IBUPROFEN 600 MG PO TABS
600.0000 mg | ORAL_TABLET | Freq: Four times a day (QID) | ORAL | Status: DC
  Administered 2018-02-01 – 2018-02-02 (×2): 600 mg via ORAL
  Filled 2018-02-01 (×2): qty 1

## 2018-02-01 MED ORDER — LACTATED RINGERS IV SOLN: 500.0000 mL | Freq: Once | INTRAVENOUS | Status: DC

## 2018-02-01 MED ORDER — OXYCODONE-ACETAMINOPHEN 5-325 MG PO TABS: 2.0000 | ORAL_TABLET | ORAL | Status: DC | PRN

## 2018-02-01 MED ORDER — ACETAMINOPHEN 325 MG PO TABS: 650.0000 mg | ORAL_TABLET | ORAL | Status: DC | PRN

## 2018-02-01 MED ORDER — EPHEDRINE 5 MG/ML INJ: 10.0000 mg | INTRAVENOUS | Status: DC | PRN

## 2018-02-01 MED ORDER — ONDANSETRON HCL 4 MG/2ML IJ SOLN: 4.0000 mg | INTRAMUSCULAR | Status: DC | PRN

## 2018-02-01 MED ORDER — SENNOSIDES-DOCUSATE SODIUM 8.6-50 MG PO TABS
2.0000 | ORAL_TABLET | ORAL | Status: DC
  Filled 2018-02-01 (×2): qty 2

## 2018-02-01 MED ORDER — SOD CITRATE-CITRIC ACID 500-334 MG/5ML PO SOLN: 30.0000 mL | ORAL | Status: DC | PRN

## 2018-02-01 MED ORDER — LACTATED RINGERS IV SOLN
INTRAVENOUS | Status: DC
  Administered 2018-02-01: 12:00:00 via INTRAVENOUS

## 2018-02-01 MED ORDER — OXYTOCIN 40 UNITS IN LACTATED RINGERS INFUSION - SIMPLE MED
2.5000 [IU]/h | INTRAVENOUS | Status: DC
  Filled 2018-02-01: qty 1000

## 2018-02-01 MED ORDER — ONDANSETRON HCL 4 MG/2ML IJ SOLN: 4.0000 mg | Freq: Four times a day (QID) | INTRAMUSCULAR | Status: DC | PRN

## 2018-02-01 MED ORDER — ONDANSETRON HCL 4 MG PO TABS: 4.0000 mg | ORAL_TABLET | ORAL | Status: DC | PRN

## 2018-02-01 MED ORDER — PHENYLEPHRINE 40 MCG/ML (10ML) SYRINGE FOR IV PUSH (FOR BLOOD PRESSURE SUPPORT)
80.0000 ug | PREFILLED_SYRINGE | INTRAVENOUS | Status: DC | PRN
  Filled 2018-02-01: qty 5
  Filled 2018-02-01: qty 10

## 2018-02-01 MED ORDER — PHENYLEPHRINE 40 MCG/ML (10ML) SYRINGE FOR IV PUSH (FOR BLOOD PRESSURE SUPPORT): 80.0000 ug | PREFILLED_SYRINGE | INTRAVENOUS | Status: DC | PRN

## 2018-02-01 MED ORDER — DIPHENHYDRAMINE HCL 25 MG PO CAPS: 25.0000 mg | ORAL_CAPSULE | Freq: Four times a day (QID) | ORAL | Status: DC | PRN

## 2018-02-01 MED ORDER — PHENYLEPHRINE 40 MCG/ML (10ML) SYRINGE FOR IV PUSH (FOR BLOOD PRESSURE SUPPORT)
80.0000 ug | PREFILLED_SYRINGE | INTRAVENOUS | Status: DC | PRN
  Filled 2018-02-01: qty 10
  Filled 2018-02-01: qty 5

## 2018-02-01 MED ORDER — WITCH HAZEL-GLYCERIN EX PADS: 1.0000 "application " | MEDICATED_PAD | CUTANEOUS | Status: DC | PRN

## 2018-02-01 NOTE — Anesthesia Pain Management Evaluation Note (Signed)
  CRNA Pain Management Visit Note  Patient: Julie [name], 27 y.o., female  "Hello I am a member of the anesthesia team at [facility]. We have an anesthesia team available at all times to provide care throughout the hospital, including epidural management and anesthesia for C-section. I don't know your plan for the delivery whether it a natural birth, water birth, IV sedation, nitrous supplementation, doula or epidural, but we want to meet your pain goals."   1.Was your pain managed to your expectations on prior hospitalizations?   Yes   2.What is your expectation for pain management during this hospitalization?     Epidural  3.How can we help you reach that goal? epidural  Record the patient's initial score and the patient's pain goal.   Pain: 0  Pain Goal: 4 [facility] wants you to be able to say your pain was always managed very well.  Wenzel Backlund 02/01/2018

## 2018-02-01 NOTE — Progress Notes (Signed)
Delivery Note At  a viable and healthy female was delivered over intact perineum via svd  (Presentation:cephalic ; L:OA ).  APGAR: , ; weight  .   Placenta status: delivered spontaneously , intact .  Cord:  3vv, with the following complications:none . During delivery of fetal head, decreased maternal effort, decreased to no effort; then appeared < 5 second shoulder dystocia.  McRobert's position then suprapubic pressure applied by nursing.  Right shoulder easily delivered. Anesthesia:  epidural Episiotomy:  none Lacerations:  none Suture Repair: n/a Est. Blood Loss (mL):   Mom to postpartum.  Baby to Couplet care / Skin to Skin.  Vick Frees 02/01/2018, 3:36 PM

## 2018-02-01 NOTE — MAU Note (Signed)
Urine in lab 

## 2018-02-01 NOTE — Anesthesia Preprocedure Evaluation (Addendum)
Anesthesia Evaluation  Patient identified by MRN, date of birth, ID band Patient awake    Reviewed: Allergy & Precautions, NPO status , Patient's Chart, lab work & pertinent test results  Airway Mallampati: II  TM Distance: >3 FB Neck ROM: Full    Dental no notable dental hx. (+) Teeth Intact   Pulmonary former smoker,    Pulmonary exam normal breath sounds clear to auscultation       Cardiovascular Exercise Tolerance: Good negative cardio ROS Normal cardiovascular exam Rhythm:Regular Rate:Normal     Neuro/Psych  Headaches,    GI/Hepatic   Endo/Other    Renal/GU      Musculoskeletal   Abdominal   Peds  Hematology   Anesthesia Other Findings   Reproductive/Obstetrics (+) Pregnancy                             Lab Results  Component Value Date   WBC 11.0 (H) 02/01/2018   HGB 11.1 (L) 02/01/2018   HCT 34.2 (L) 02/01/2018   MCV 87.9 02/01/2018   PLT 320 02/01/2018    Anesthesia Physical Anesthesia Plan  ASA: II  Anesthesia Plan: Epidural   Post-op Pain Management:    Induction:   PONV Risk Score and Plan:   Airway Management Planned:   Additional Equipment:   Intra-op Plan:   Post-operative Plan:   Informed Consent: I have reviewed the patients History and Physical, chart, labs and discussed the procedure including the risks, benefits and alternatives for the proposed anesthesia with the patient or authorized representative who has indicated his/her understanding and acceptance.     Plan Discussed with:   Anesthesia Plan Comments:         Anesthesia Quick Evaluation

## 2018-02-01 NOTE — H&P (Signed)
Sarem Sergio is a 27 y.o. G3P1011 at [redacted]w[redacted]d gestation presents for complaint of Loss of fluid (clear) at 0730 this am.  Denies vb, does note +FM. H/o cz this pregnancy, neg TOC.  No other complication.  Antepartum course: uncomplicated  [facility] at [facility] since 25 wks (transfer of care).  See complete pre-natal records  History OB History    Gravida  3   Para  1   Term  1   Preterm      AB  1   Living  1     SAB      TAB  1   Ectopic      Multiple      Live Births  1          Past Medical History:  Diagnosis Date  . Headache    MIGRAINES  . Medical history non-contributory   . Postpartum care following vaginal delivery (12/27) 05/30/2013   Past Surgical History:  Procedure Laterality Date  . DILATION AND EVACUATION N/A 08/20/2016   Procedure: DILATATION AND EVACUATION;  Surgeon: Genia Del, MD;  Location: [facility];  Service: Gynecology;  Laterality: N/A;  . NO PAST SURGERIES     Family History: family history is not on file. Social History:  reports that she quit smoking about 5 years ago. Her smoking use included cigarettes. She has never used smokeless tobacco. She reports that she does not drink alcohol or use drugs.  ROS: See above otherwise negative  Prenatal labs:  ABO, Rh:  pos Antibody:  neg Rubella: Immune (09/01 0000) RPR: Nonreactive (09/01 0000)  HBsAg: Negative (09/01 0000)  HIV:Non-reactive (09/01 0000)  GBS: Negative (08/19 0000)  1 hr Glucola: Normal Genetic screening: Normal Anatomy US: Normal  Physical Exam:   Dilation: 6 Effacement (%): 90 Station: -1 Exam by:: dr Reyne Falconi Blood pressure 129/90, pulse 91, temperature 97.7 F (36.5 C), temperature source Oral, resp. rate 18, height 5\' 3"  (1.6 m), weight 73.8 kg, unknown if currently breastfeeding. A&O x 3 HEENT: Normal Lungs: CTAB CV: RRR Abdominal: Soft, Non-tender, Gravid and Estimated fetal weight: 7lbs lbs  Lower Extremities: Non-edematous,  Non-tender  Pelvic Exam:      Dilatation: 6cm     Effacement: 90%     Station: -1     Presentation: Cephalic  Labs:  CBC:  Lab Results  Component Value Date   WBC 11.0 (H) 02/01/2018   RBC 3.89 02/01/2018   HGB 11.1 (L) 02/01/2018   HCT 34.2 (L) 02/01/2018   MCV 87.9 02/01/2018   MCH 28.5 02/01/2018   MCHC 32.5 02/01/2018   RDW 14.1 02/01/2018   PLT 320 02/01/2018   CMP: No results found for: NA, K, CL, CO2, GLUCOSE, BUN, CREATININE, CALCIUM, PROT, AST, ALT, ALBUMIN, ALKPHOS, BILITOT, GFRNONAA, GFRAA, ANIONGAP Urine: Lab Results  Component Value Date   COLORURINE YELLOW 10/12/2012   APPEARANCEUR CLOUDY (A) 10/12/2012   LABSPEC 1.020 12/25/2012   PHURINE 7.0 12/25/2012   GLUCOSEU NEGATIVE 12/25/2012   HGBUR TRACE (A) 12/25/2012   BILIRUBINUR NEGATIVE 12/25/2012   KETONESUR NEGATIVE 12/25/2012   PROTEINUR NEGATIVE 12/25/2012   NITRITE NEGATIVE 12/25/2012   LEUKOCYTESUR LARGE (A) 12/25/2012     Prenatal Transfer Tool  Maternal Diabetes: No Genetic Screening: Normal Maternal Ultrasounds/Referrals: Normal Fetal Ultrasounds or other Referrals:  None Maternal Substance Abuse:  No Significant Maternal Medications:  None Significant Maternal Lab Results: Lab values include: Group B Strep negative  Fht: 130s, nml variability,+accels, no decels Toco: q 1-3  min  Assessment/Plan:  27 y.o. G3P1011 at [redacted]w[redacted]d gestation   1. SROM, active labor - admit and plan svd 2. Fetal status reassuring 3. gbs neg   Vick Frees 02/01/2018, 1:00 PM

## 2018-02-01 NOTE — MAU Note (Signed)
Reports LOF since 0730, clear, still leaking now  Ctx q 4 min  No bleeding, +FM

## 2018-02-01 NOTE — Anesthesia Procedure Notes (Signed)
Epidural Patient location during procedure: OB Start time: 02/01/2018 12:56 PM End time: 02/01/2018 1:09 PM  Staffing Anesthesiologist: Trevor Iha, MD Performed: anesthesiologist   Preanesthetic Checklist Completed: patient identified, site marked, surgical consent, pre-op evaluation, timeout performed, IV checked, risks and benefits discussed and monitors and equipment checked  Epidural Patient position: sitting Prep: site prepped and draped and DuraPrep Patient monitoring: continuous pulse ox and blood pressure Approach: midline Location: L3-L4 Injection technique: LOR air  Needle:  Needle type: Tuohy  Needle gauge: 17 G Needle length: 9 cm and 9 Needle insertion depth: 5 cm cm Catheter type: closed end flexible Catheter size: 19 Gauge Catheter at skin depth: 10 cm Test dose: negative  Assessment Events: blood not aspirated, injection not painful, no injection resistance, negative IV test and no paresthesia  Additional Notes 1 attempt. Pt tolerated procedure well

## 2018-02-02 LAB — CBC
HCT: 30.4 % — ABNORMAL LOW (ref 36.0–46.0)
Hemoglobin: 9.9 g/dL — ABNORMAL LOW (ref 12.0–15.0)
MCH: 28.5 pg (ref 26.0–34.0)
MCHC: 32.6 g/dL (ref 30.0–36.0)
MCV: 87.6 fL (ref 78.0–100.0)
PLATELETS: 266 10*3/uL (ref 150–400)
RBC: 3.47 MIL/uL — ABNORMAL LOW (ref 3.87–5.11)
RDW: 14.3 % (ref 11.5–15.5)
WBC: 14.4 10*3/uL — AB (ref 4.0–10.5)

## 2018-02-02 LAB — RPR: RPR Ser Ql: NONREACTIVE

## 2018-02-02 MED ORDER — IBUPROFEN 800 MG PO TABS
800.0000 mg | ORAL_TABLET | Freq: Four times a day (QID) | ORAL | Status: DC
  Administered 2018-02-02 – 2018-02-03 (×4): 800 mg via ORAL
  Filled 2018-02-02 (×5): qty 1

## 2018-02-02 NOTE — Progress Notes (Signed)
PPD 1 SVD  S:  Reports feeling well - moderate cramps             Tolerating po/ No nausea or vomiting             Bleeding is light             Pain minimally controlled with motrin              Up ad lib / ambulatory / voiding QS  Newborn Breast   O:      VS: BP 118/71 (BP Location: Right Arm)   Pulse 82   Temp 98.1 F (36.7 C) (Oral)   Resp 18   Ht 5\' 3"  (1.6 m)   Wt 73.5 kg   SpO2 100%   Breastfeeding? Unknown   BMI 28.70 kg/m   LABS:             Recent Labs    02/01/18 1201 02/02/18 0624  WBC 11.0* 14.4*  HGB 11.1* 9.9*  PLT 320 266               Blood type: --/--/A POS (09/01 1201)  Rubella: Immune (09/01 0000)                     I&O: Intake/Output      09/01 0701 - 09/02 0700 09/02 0701 - 09/03 0700   Urine (mL/kg/hr) 0    Blood 150    Total Output 150    Net -150         Urine Occurrence 1 x                  Physical Exam:             Alert and oriented X3  Abdomen: soft, non-tender, non-distended              Fundus: firm, non-tender, U-1  Perineum: mild edema  Lochia: light   Extremities: noedema, no calf pain or tenderness    A: PPD # 1              Mild ABL anemia - stable  Doing well - stable status  P: Routine post partum orders             Increase Motrin dose    Marlinda Mike CNM, MSN, [facility] 02/02/2018, 10:54 AM

## 2018-02-02 NOTE — Anesthesia Postprocedure Evaluation (Signed)
Anesthesia Post Note  Patient: Julie [name]  Procedure(s) Performed: AN AD HOC LABOR EPIDURAL     Patient location during evaluation: Mother Baby Anesthesia Type: Epidural Level of consciousness: awake, awake and alert, oriented and patient cooperative Pain management: pain level controlled Vital Signs Assessment: post-procedure vital signs reviewed and stable Respiratory status: spontaneous breathing, nonlabored ventilation and respiratory function stable Cardiovascular status: stable Postop Assessment: no headache, no backache, patient able to bend at knees and no apparent nausea or vomiting Anesthetic complications: no    Last Vitals:  Vitals:   02/02/18 0327 02/02/18 0521  BP: 124/73 118/71  Pulse: 68 82  Resp: 18 18  Temp: (!) 36.4 C 36.7 C  SpO2:      Last Pain:  Vitals:   02/02/18 0521  TempSrc: Oral  PainSc: 0-No pain   Pain Goal:                 Julie [name] L

## 2018-02-02 NOTE — Lactation Note (Signed)
This note was copied from a baby's chart. Lactation Consultation Note  Patient Name: Julie [name] SMOLM'B Date: 02/02/2018 Reason for consult: Initial assessment;Early term 37-38.6wks P2, 15 hour infant, ETI Per mom, BF her first child for 1 month. LC assisted mom with latch, mom latched baby on right breast using the cross cradle position, LC flange top lip and lowered bottom jaw for deeper latch, swallows observed and heard. Latch-9. Infant BF 10 mins when LC left room.  Per mom,  Infant's latch is feeling better w/ this feeding, previously she felt pinching when infant latched to breast. LC discussed infant behaviors and feeding for ETI. Mom will limit feeding 30 minutes or less. LC discussed I&O. Mom will BF according hunger cues, 8 to 12 times within 24 hours including nights, mom will not exceed 3 hours without feeding infant. Reviewed Baby & Me book's Breastfeeding Basics.  Mom made aware of O/P services, breastfeeding support groups, community resources, and our phone # for post-discharge questions.   Maternal Data Formula Feeding for Exclusion: No Has patient been taught Hand Expression?: Yes Does the patient have breastfeeding experience prior to this delivery?: Yes  Feeding Feeding Type: Breast Fed Length of feed: 10 min(Still BF as LC left room.)  LATCH Score Latch: Grasps breast easily, tongue down, lips flanged, rhythmical sucking.  Audible Swallowing: Spontaneous and intermittent  Type of Nipple: Everted at rest and after stimulation  Comfort (Breast/Nipple): Soft / non-tender  Hold (Positioning): Assistance needed to correctly position infant at breast and maintain latch.  LATCH Score: 9  Interventions Interventions: Breast feeding basics reviewed;Support pillows;Assisted with latch;Position options;Breast massage;Hand express;Adjust position  Lactation Tools Discussed/Used WIC Program: No   Consult Status      Danelle Earthly 02/02/2018, 6:54  AM

## 2018-02-03 MED ORDER — IBUPROFEN 800 MG PO TABS: 800.0000 mg | ORAL_TABLET | Freq: Four times a day (QID) | ORAL | 0 refills | Status: AC

## 2018-02-03 NOTE — Progress Notes (Signed)
PPD 2 SVD  S:  Ready to go home             Tolerating po/ No nausea or vomiting             Bleeding is light             Pain controlled with motrin             Up ad lib / ambulatory / voiding QS  Newborn Breast / Circumcision declined  O:   VS: BP 128/82 (BP Location: Right Arm)   Pulse 68   Temp 97.9 F (36.6 C) (Oral)   Resp 18   Ht 5\' 3"  (1.6 m)   Wt 73.5 kg   SpO2 100%   Breastfeeding? Unknown   BMI 28.70 kg/m    Physical Exam:             Alert and oriented X3  Abdomen: soft, non-tender, non-distended              Fundus: firm, non-tender, Ueven  Perineum: intact  Lochia: light  Extremities: no edema, no calf pain or tenderness  A: PPD # 2   Doing well - stable status  P: Routine post partum orders  DC home  Marlinda Mike CNM, MSN, [facility] 02/03/2018, 8:26 AM

## 2018-02-03 NOTE — Discharge Summary (Signed)
Obstetric Discharge Summary Reason for Admission: onset of labor Prenatal Procedures: none Intrapartum Procedures: spontaneous vaginal delivery and epidural Postpartum Procedures: none Complications-Operative and Postpartum: shoulder dystocia Hemoglobin  Date Value Ref Range Status  02/02/2018 9.9 (L) 12.0 - 15.0 g/dL Final   HCT  Date Value Ref Range Status  02/02/2018 30.4 (L) 36.0 - 46.0 % Final    Physical Exam:  General: alert, cooperative and no distress Lochia: appropriate Uterine Fundus: firm DVT Evaluation: No evidence of DVT seen on physical exam.  Discharge Diagnoses: Term Pregnancy-delivered  Discharge Information: Date: 02/03/2018 Activity: pelvic rest Diet: routine Medications: PNV, Ibuprofen and Iron Condition: stable Instructions: refer to practice specific booklet Discharge to: home Follow-up Information    Vick Frees, MD. Schedule an appointment as soon as possible for a visit in 6 week(s).   Specialty:  Obstetrics and Gynecology Contact information: [address] [place] [address] [phone]           Newborn Data: Live born female  Birth Weight: 7 lb 2.6 oz (3249 g) APGAR: 8, 9  Newborn Delivery   Time head delivered:  02/01/2018 15:29:00 Birth date/time:  02/01/2018 15:29:00 Delivery type:  Vaginal, Spontaneous     Home with mother.  Marlinda Mike 02/03/2018, 8:47 AM

## 2019-07-27 DIAGNOSIS — F339 Major depressive disorder, recurrent, unspecified: Secondary | ICD-10-CM | POA: Insufficient documentation

## 2020-04-20 ENCOUNTER — Encounter (HOSPITAL_COMMUNITY): Payer: Self-pay

## 2020-04-20 ENCOUNTER — Other Ambulatory Visit: Payer: Self-pay

## 2020-04-20 ENCOUNTER — Emergency Department (HOSPITAL_COMMUNITY)
Admission: EM | Admit: 2020-04-20 | Discharge: 2020-04-21 | Disposition: A | Discharge status: Home / Self Care | Payer: Self-pay | Attending: Emergency Medicine | Admitting: Emergency Medicine

## 2020-04-20 DIAGNOSIS — Z87891 Personal history of nicotine dependence: Secondary | ICD-10-CM | POA: Insufficient documentation

## 2020-04-20 DIAGNOSIS — Z20822 Contact with and (suspected) exposure to covid-19: Secondary | ICD-10-CM | POA: Insufficient documentation

## 2020-04-20 DIAGNOSIS — R945 Abnormal results of liver function studies: Secondary | ICD-10-CM | POA: Insufficient documentation

## 2020-04-20 DIAGNOSIS — N12 Tubulo-interstitial nephritis, not specified as acute or chronic: Secondary | ICD-10-CM | POA: Insufficient documentation

## 2020-04-20 DIAGNOSIS — R109 Unspecified abdominal pain: Secondary | ICD-10-CM

## 2020-04-20 DIAGNOSIS — N83201 Unspecified ovarian cyst, right side: Secondary | ICD-10-CM | POA: Insufficient documentation

## 2020-04-20 DIAGNOSIS — R7989 Other specified abnormal findings of blood chemistry: Secondary | ICD-10-CM

## 2020-04-20 DIAGNOSIS — Z91040 Latex allergy status: Secondary | ICD-10-CM | POA: Insufficient documentation

## 2020-04-20 LAB — CBC WITH DIFFERENTIAL/PLATELET
Abs Immature Granulocytes: 0.05 10*3/uL (ref 0.00–0.07)
Basophils Absolute: 0 10*3/uL (ref 0.0–0.1)
Basophils Relative: 0 %
Eosinophils Absolute: 0 10*3/uL (ref 0.0–0.5)
Eosinophils Relative: 0 %
HCT: 40.9 % (ref 36.0–46.0)
Hemoglobin: 13.3 g/dL (ref 12.0–15.0)
Immature Granulocytes: 0 %
Lymphocytes Relative: 6 %
Lymphs Abs: 0.8 10*3/uL (ref 0.7–4.0)
MCH: 31 pg (ref 26.0–34.0)
MCHC: 32.5 g/dL (ref 30.0–36.0)
MCV: 95.3 fL (ref 80.0–100.0)
Monocytes Absolute: 1 10*3/uL (ref 0.1–1.0)
Monocytes Relative: 8 %
Neutro Abs: 10.4 10*3/uL — ABNORMAL HIGH (ref 1.7–7.7)
Neutrophils Relative %: 86 %
Platelets: 260 10*3/uL (ref 150–400)
RBC: 4.29 MIL/uL (ref 3.87–5.11)
RDW: 12.4 % (ref 11.5–15.5)
WBC: 12.3 10*3/uL — ABNORMAL HIGH (ref 4.0–10.5)
nRBC: 0 % (ref 0.0–0.2)

## 2020-04-20 LAB — URINALYSIS, ROUTINE W REFLEX MICROSCOPIC
Bilirubin Urine: NEGATIVE
Glucose, UA: NEGATIVE mg/dL
Hgb urine dipstick: NEGATIVE
Ketones, ur: NEGATIVE mg/dL
Nitrite: NEGATIVE
Protein, ur: NEGATIVE mg/dL
Specific Gravity, Urine: 1.023 (ref 1.005–1.030)
pH: 6 (ref 5.0–8.0)

## 2020-04-20 LAB — COMPREHENSIVE METABOLIC PANEL
ALT: 132 U/L — ABNORMAL HIGH (ref 0–44)
AST: 88 U/L — ABNORMAL HIGH (ref 15–41)
Albumin: 4.1 g/dL (ref 3.5–5.0)
Alkaline Phosphatase: 130 U/L — ABNORMAL HIGH (ref 38–126)
Anion gap: 9 (ref 5–15)
BUN: 16 mg/dL (ref 6–20)
CO2: 22 mmol/L (ref 22–32)
Calcium: 8.6 mg/dL — ABNORMAL LOW (ref 8.9–10.3)
Chloride: 104 mmol/L (ref 98–111)
Creatinine, Ser: 0.79 mg/dL (ref 0.44–1.00)
GFR, Estimated: >60 mL/min (ref >60)
Glucose, Bld: 113 mg/dL — ABNORMAL HIGH (ref 70–99)
Potassium: 4.4 mmol/L (ref 3.5–5.1)
Sodium: 135 mmol/L (ref 135–145)
Total Bilirubin: 0.5 mg/dL (ref 0.3–1.2)
Total Protein: 7.4 g/dL (ref 6.5–8.1)

## 2020-04-20 LAB — LIPASE, BLOOD: Lipase: 27 U/L (ref 11–51)

## 2020-04-20 LAB — PREGNANCY, URINE: Preg Test, Ur: NEGATIVE

## 2020-04-20 MED ORDER — SODIUM CHLORIDE 0.9 % IV BOLUS
1000.0000 mL | Freq: Once | INTRAVENOUS | Status: AC
  Administered 2020-04-20: 1000 mL via INTRAVENOUS

## 2020-04-20 NOTE — ED Provider Notes (Signed)
[facility]-EMERGENCY DEPT Provider Note   CSN: [id number] Arrival date & time: 04/20/20  2135     History Chief Complaint  Patient presents with  . Flank Pain    Julie [name] is a 29 y.o. female with a hx of prior tobacco use who presents to the ED with complaints of flank pain since yesterday.  Patient states she has having constant pain to the right flank that intermittently wraps around to the general right side of the abdomen.  Pain is steady, worse in certain positions, no alleviating factors.  She has tried ibuprofen and Tylenol without much relief.  She has had associated fevers to 102, chills, as well as some mild nausea that has resolved at this point.  She last had ibuprofen around 11:00 this morning and last had Tylenol around 730 this evening.  She denies any congestion, ear pain, sore throat, neck stiffness, cough, dyspnea, chest pain, vomiting, diarrhea, melena, dysuria, urgency, frequency, or vaginal bleeding/discharge.  She has no concern for STDs.  She denies history of IVDU. Denies leg pain/swelling, hemoptysis, recent surgery/trauma, recent long travel, hormone use, personal hx of cancer, or hx of DVT/PE.  Denies numbness, tingling, weakness, or incontinence.  HPI     Past Medical History:  Diagnosis Date  . Headache    MIGRAINES  . Medical history non-contributory   . Postpartum care following vaginal delivery (12/27) 05/30/2013    Patient Active Problem List   Diagnosis Date Noted  . Postpartum care following vaginal delivery (9/1) 02/02/2018  . Normal labor 02/01/2018    Past Surgical History:  Procedure Laterality Date  . DILATION AND EVACUATION N/A 08/20/2016   Procedure: DILATATION AND EVACUATION;  Surgeon: Genia Del, MD;  Location: [facility];  Service: Gynecology;  Laterality: N/A;  . NO PAST SURGERIES       OB History    Gravida  3   Para  2   Term  2   Preterm      AB  1   Living  2     SAB      TAB  1    Ectopic      Multiple  0   Live Births  2           Family History  Problem Relation Age of Onset  . Asthma Neg Hx   . Diabetes Neg Hx   . Depression Neg Hx   . Heart disease Neg Hx   . Hyperlipidemia Neg Hx     Social History   Tobacco Use  . Smoking status: Former Smoker    Types: Cigarettes    Quit date: 09/01/2012    Years since quitting: 7.6  . Smokeless tobacco: Never Used  Vaping Use  . Vaping Use: Never used  Substance Use Topics  . Alcohol use: No  . Drug use: No    Home Medications Prior to Admission medications   Medication Sig Start Date End Date Taking? Authorizing Provider  acetaminophen (TYLENOL) 500 MG tablet Take 1,000 mg by mouth every 6 (six) hours as needed for mild pain.   Yes [provider]  ibuprofen (ADVIL,MOTRIN) 800 MG tablet Take 1 tablet (800 mg total) by mouth every 6 (six) hours. Patient taking differently: Take 800 mg by mouth every 8 (eight) hours as needed for moderate pain.  02/03/18  Yes Marlinda Mike., CNM    Allergies    Latex  Review of Systems   Review of Systems  Constitutional:  Positive for chills and fever.  Respiratory: Negative for cough and shortness of breath.   Cardiovascular: Negative for leg swelling.  Gastrointestinal: Positive for nausea. Negative for anal bleeding, blood in stool, constipation, diarrhea and vomiting.  Genitourinary: Positive for flank pain. Negative for dysuria, frequency, hematuria, urgency, vaginal bleeding and vaginal discharge.  Neurological: Negative for syncope, weakness and numbness.       Negative for incontinence or saddle anesthesia.  All other systems reviewed and are negative.   Physical Exam Updated Vital Signs BP 138/83 (BP Location: Right Arm)   Pulse 91   Temp 98.1 F (36.7 C) (Oral)   Resp 18   Ht  (1.6 m)   Wt 59 kg   SpO2 95%   BMI 23.03 kg/m   Physical Exam Vitals and nursing note reviewed.  Constitutional:      General: She is not in acute  distress.    Appearance: She is well-developed. She is not toxic-appearing.  HENT:     Head: Normocephalic and atraumatic.  Eyes:     General:        Right eye: No discharge.        Left eye: No discharge.     Conjunctiva/sclera: Conjunctivae normal.  Cardiovascular:     Rate and Rhythm: Normal rate and regular rhythm.  Pulmonary:     Effort: Pulmonary effort is normal. No respiratory distress.     Breath sounds: Normal breath sounds. No wheezing, rhonchi or rales.  Abdominal:     General: There is no distension.     Palpations: Abdomen is soft.     Tenderness: There is abdominal tenderness (mild R abdomen). There is no right CVA tenderness, left CVA tenderness, guarding or rebound.  Genitourinary:    Comments: Deferred Musculoskeletal:     Cervical back: Neck supple. No rigidity.     Comments: Back: No midline tenderness to palpation.  Patient has right thoracic and lumbar paraspinal muscle tenderness to palpation, she also has right CVA tenderness. Lower extremities: No edema, erythema, or increased warmth.  She has intact active range of motion to her bilateral lower extremities, no focal bony tenderness.  Skin:    General: Skin is warm and dry.     Findings: No rash.  Neurological:     Mental Status: She is alert.     Comments: Clear speech.  Sensation grossly intact to bilateral lower extremities.  5 out of 5 symmetric strength with plantar dorsiflexion bilaterally.  Patient is ambulatory.  Psychiatric:        Behavior: Behavior normal.    ED Results / Procedures / Treatments   Labs (all labs ordered are listed, but only abnormal results are displayed) Labs Reviewed  URINALYSIS, ROUTINE W REFLEX MICROSCOPIC - Abnormal; Notable for the following components:      Result Value   APPearance HAZY (*)    Leukocytes,Ua TRACE (*)    Bacteria, UA RARE (*)    All other components within normal limits  COMPREHENSIVE METABOLIC PANEL - Abnormal; Notable for the following  components:   Glucose, Bld 113 (*)    Calcium 8.6 (*)    AST 88 (*)    ALT 132 (*)    Alkaline Phosphatase 130 (*)    All other components within normal limits  CBC WITH DIFFERENTIAL/PLATELET - Abnormal; Notable for the following components:   WBC 12.3 (*)    Neutro Abs 10.4 (*)    All other components within normal limits  RESP PANEL  BY RT-PCR (FLU A&B, COVID) [id number]  URINE CULTURE  PREGNANCY, URINE  LIPASE, BLOOD    EKG None  Radiology CT Abdomen Pelvis W Contrast  Result Date: 04/21/2020 CLINICAL DATA:  Fever, flank pain, leukocytosis EXAM: CT ABDOMEN AND PELVIS WITH CONTRAST TECHNIQUE: Multidetector CT imaging of the abdomen and pelvis was performed using the standard protocol following bolus administration of intravenous contrast. CONTRAST:  OMNIPAQUE IOHEXOL 300 MG/ML  SOLN COMPARISON:  None. FINDINGS: Lower chest: Atelectatic changes in the lung bases. Lung bases are clear. Normal heart size. No pericardial effusion. Hepatobiliary: Diffuse hepatic hypoattenuation, possibly related to contrast timing versus mild hepatic steatosis. Smooth liver surface contour. No focal concerning liver lesion. Normal gallbladder and biliary tree. Pancreas: No pancreatic ductal dilatation or surrounding inflammatory changes. Spleen: Normal in size. No concerning splenic lesions. Small accessory splenule seen inferior to the spleen. Adrenals/Urinary Tract: No pancreatic ductal dilatation or surrounding inflammatory changes. Stomach/Bowel: Normal adrenal glands. While the kidneys enhance symmetrically, there is asymmetric mild right perinephric stranding with slight ureterectasis and urothelial thickening to the level of the bladder without visible obstructing urolith. Paired 11 mm thick-walled, cystic lesions are seen in the upper pole right kidney with some adjacent perinephric stranding as well, most concerning for small renal abscess or superinfected cyst. No other concerning renal masses. No  left urinary tract dilatation. Mild circumferential bladder wall thickening and faint perivesicular hazy stranding. Vascular/Lymphatic: No significant vascular findings are present. No enlarged abdominal or pelvic lymph nodes. Mild reactive upper abdominal adenopathy is seen. Reproductive: Anteverted uterus heterogeneity at the uterine enhancement is nonspecific and possibly contrast timing related. Arcuate configuration of the uterine fundus. Prominence of the endometrium may be related to secretory phase. Likely collapsing corpus luteum seen in the left ovary. Larger 2.5 cm cyst in the right ovary with some intermediate attenuation, possibly hemorrhagic cyst versus endometrioma. Other: No free fluid or free air in the abdomen or pelvis. Musculoskeletal: No acute osseous abnormality or suspicious osseous lesion. IMPRESSION: 1. Mild right ureterectasis with marked urothelial thickening and some hazy perinephric and periureteral stranding and mild bladder wall thickening raises concern for ascending urinary tract infection. Paired 11 mm thick-walled, cystic lesions in the upper pole right kidney with some adjacent focal stranding as well, could reflect renal abscess or superinfected cyst. 2. Larger 2.5 cm cyst in the right ovary with some intermediate attenuation, possibly hemorrhagic cyst versus endometrioma. Could consider further evaluation pelvic ultrasound as clinically warranted. 3. Diffuse hepatic hypoattenuation, possibly related to contrast timing versus mild hepatic steatosis. Electronically Signed   By: Kreg Shropshire M.D.   On: 04/21/2020 01:29    Procedures Procedures (including critical care time)  Medications Ordered in ED Medications - No data to display  ED Course  I have reviewed the triage vital signs and the nursing notes.  Pertinent labs & imaging results that were available during my care of the patient were reviewed by me and considered in my medical decision making (see chart for  details).    MDM Rules/Calculators/A&P                         Patient presents to the ED with complaints of right flank pain & fever.  Nontoxic, vitals WNL in the ED, did have antipyretics this evening prior to arrival.  Exam w/ R CVA tenderness/paraspinal muscle tenderness & R sided abdominal tenderness. No peritoneal signs.   Additional history obtained:  Additional history obtained from chart review &  nursing note review.  Lab Tests:  I Ordered, reviewed, and interpreted labs, which included:  CBC: Mild leukocytosis with left shift.  CMP: Transaminitis. Renal function preserved.  Lipase: WNL COVID/influenza: Negative UA: Trace leukocytes & rare bacteria, squamous epithelial cells, culture sent.   Imaging Studies ordered:  I ordered imaging studies which included CT A/P, I independently visualized and interpreted imaging which showed:  1. Mild right ureterectasis with marked urothelial thickening and some hazy perinephric and periureteral stranding and mild bladder wall thickening raises concern for ascending urinary tract infection. Paired 11 mm thick-walled, cystic lesions in the upper pole right kidney with some adjacent focal stranding as well, could reflect renal abscess or superinfected cyst. 2. Larger 2.5 cm cyst in the right ovary with some intermediate attenuation, possibly hemorrhagic cyst versus endometrioma. Could consider further evaluation pelvic ultrasound as clinically warranted. 3. Diffuse hepatic hypoattenuation, possibly related to contrast timing versus mild hepatic steatosis.   ED Course:  Will discuss w/ urology based on renal/uerteral findings.  Regarding R ovarian cyst- no pelvic complaints or lower abdominal pain- PCP/GYN follow up- do not suspect torsion   03:03: CONSULT: Discussed with urologist Dr. Mena Goes- reviewed patient presentation, labs, imaging etc.- Recommends tx for pyelonephritis (discussed bactrim vs. cipro as possible abx), first dose in the ED,  & discharge home with urology follow up, will need repeat US, strict return precautions. Appreciate conulstation.  Given IV rocephin x 1 and first dose of bactrim in the ED. Feeling better, tolerating PO. Will discharge home per plan with urology. I discussed results, treatment plan, need for follow-up, and return precautions with the patient. Provided opportunity for questions, patient confirmed understanding and is in agreement with plan.    Findings and plan of care discussed with supervising physician Dr. Nicanor Alcon who is in agreement.   Portions of this note were generated with Scientist, clinical (histocompatibility and immunogenetics). Dictation errors may occur despite best attempts at proofreading.  Final Clinical Impression(s) / ED Diagnoses Final diagnoses:  Flank pain  Pyelonephritis  Elevated LFTs  Cyst of right ovary    Rx / DC Orders ED Discharge Orders         Ordered    sulfamethoxazole-trimethoprim (BACTRIM DS) 800-160 MG tablet  2 times daily        04/21/20 0548    ondansetron (ZOFRAN ODT) 4 MG disintegrating tablet  Every 8 hours PRN        04/21/20 0548           Malaysha Arlen, Pleas Koch, PA-C 04/21/20 0600    Palumbo, April, MD 04/21/20 [phone]

## 2020-04-20 NOTE — ED Triage Notes (Signed)
Pt sts right sided flank pain for 2 days with fevers as high as 102 at home. Pt says she took tylenol at [facility]. Denies dysuria.

## 2020-04-21 ENCOUNTER — Emergency Department (HOSPITAL_COMMUNITY): Payer: Self-pay

## 2020-04-21 LAB — RESP PANEL BY RT-PCR (FLU A&B, COVID) ARPGX2
Influenza A by PCR: NEGATIVE
Influenza B by PCR: NEGATIVE
SARS Coronavirus 2 by RT PCR: NEGATIVE

## 2020-04-21 LAB — URINE CULTURE: Culture: 60000 — AB

## 2020-04-21 MED ORDER — ONDANSETRON HCL 4 MG/2ML IJ SOLN
4.0000 mg | Freq: Once | INTRAMUSCULAR | Status: AC
  Administered 2020-04-21: 4 mg via INTRAVENOUS
  Filled 2020-04-21: qty 2

## 2020-04-21 MED ORDER — SODIUM CHLORIDE 0.9 % IV SOLN
1.0000 g | Freq: Once | INTRAVENOUS | Status: AC
  Administered 2020-04-21: 1 g via INTRAVENOUS
  Filled 2020-04-21: qty 10

## 2020-04-21 MED ORDER — SULFAMETHOXAZOLE-TRIMETHOPRIM 800-160 MG PO TABS
1.0000 | ORAL_TABLET | Freq: Once | ORAL | Status: AC
  Administered 2020-04-21: 1 via ORAL
  Filled 2020-04-21: qty 1

## 2020-04-21 MED ORDER — MORPHINE SULFATE (PF) 4 MG/ML IV SOLN
4.0000 mg | Freq: Once | INTRAVENOUS | Status: AC
  Administered 2020-04-21: 4 mg via INTRAVENOUS
  Filled 2020-04-21: qty 1

## 2020-04-21 MED ORDER — SULFAMETHOXAZOLE-TRIMETHOPRIM 800-160 MG PO TABS: 1.0000 | ORAL_TABLET | Freq: Two times a day (BID) | ORAL | 0 refills | Status: AC

## 2020-04-21 MED ORDER — IOHEXOL 300 MG/ML  SOLN
100.0000 mL | Freq: Once | INTRAMUSCULAR | Status: AC | PRN
  Administered 2020-04-21: 100 mL via INTRAVENOUS

## 2020-04-21 MED ORDER — SODIUM CHLORIDE (PF) 0.9 % IJ SOLN
INTRAMUSCULAR | Status: AC
  Filled 2020-04-21: qty 50

## 2020-04-21 MED ORDER — HYDROMORPHONE HCL 1 MG/ML IJ SOLN
0.5000 mg | Freq: Once | INTRAMUSCULAR | Status: AC
  Administered 2020-04-21: 0.5 mg via INTRAVENOUS
  Filled 2020-04-21: qty 1

## 2020-04-21 MED ORDER — ONDANSETRON 4 MG PO TBDP: 4.0000 mg | ORAL_TABLET | Freq: Three times a day (TID) | ORAL | 0 refills | Status: DC | PRN

## 2020-04-21 NOTE — Discharge Instructions (Signed)
You were seen in the emergency department today for flank pain and a fever. We are concerned that you have a kidney infection known as pyelonephritis based on inflammation around the kidney and the ureter.  Also have an area that looks like a small cyst or abscess as well.  We discussed with a urologist who has recommended antibiotics and follow-up in their office for this.  We are starting you on Bactrim, please take this as prescribed.  We gave your first dose for this morning in the emergency department.  Please start with your next dose this evening.  Please take all of your antibiotics until finished. You may develop abdominal discomfort or diarrhea from the antibiotic.  You may help offset this with probiotics which you can buy at the store (ask your pharmacist if unable to find) or get probiotics in the form of eating yogurt. Do not eat or take the probiotics until 2 hours after your antibiotic. If you are unable to tolerate these side effects follow-up with your primary care provider or return to the emergency department.   If you begin to experience any blistering, rashes, swelling, or difficulty breathing seek medical care for evaluation of potentially more serious side effects.    Your CT scan also showed findings of a right-sided ovarian cyst, please discuss with your primary care provider or with OB/GYN.  Your labs also show that your liver function tests are elevated, please have these rechecked by primary care as well.  We are sending you home with Zofran to help with any nausea or vomiting, you may take this every 8 hours as needed.  Please continue to take Tylenol per over-the-counter dosing to help with discomfort.   We have prescribed you new medication(s) today. Discuss the medications prescribed today with your pharmacist as they can have adverse effects and interactions with your other medicines including over the counter and prescribed medications. Seek medical evaluation if you  start to experience new or abnormal symptoms after taking one of these medicines, seek care immediately if you start to experience difficulty breathing, feeling of your throat closing, facial swelling, or rash as these could be indications of a more serious allergic reaction  Please call urology for a follow-up appointment within the next 1 to 5 days.  Please follow-up with your primary care provider.  Return to the ER for any new or worsening symptoms including but not limited to worsening pain, fever after 24 hours of antibiotics, inability to keep fluids down, new discomfort, passing out, or any other concerns.

## 2020-04-22 LAB — URINE CULTURE

## 2020-04-23 NOTE — Progress Notes (Signed)
ED Antimicrobial Stewardship Positive Culture Follow Up   Julie [name] is an 29 y.o. female who presented to [facility] on 04/20/2020 with a chief complaint of  Chief Complaint  Patient presents with  . Flank Pain    Recent Results (from the past 720 hour(s))  Urine culture     Status: Abnormal   Collection Time: 04/20/20 10:35 PM   Specimen: Urine, Clean Catch  Result Value Ref Range Status   Specimen Description   Final    URINE, CLEAN CATCH Performed at [facility], [address], [place], [place] [address]    Special Requests   Final    NONE Performed at [facility], [address], [place], [place] [address]    Culture 60,000 COLONIES/mL ESCHERICHIA COLI (A)  Final   Report Status 04/22/2020 FINAL  Final   Organism ID, Bacteria ESCHERICHIA COLI (A)  Final      Susceptibility   Escherichia coli - MIC*    AMPICILLIN >=32 RESISTANT Resistant     CEFAZOLIN 16 SENSITIVE Sensitive     CEFEPIME <=0.12 SENSITIVE Sensitive     CEFTRIAXONE <=0.25 SENSITIVE Sensitive     CIPROFLOXACIN <=0.25 SENSITIVE Sensitive     GENTAMICIN <=1 SENSITIVE Sensitive     IMIPENEM <=0.25 SENSITIVE Sensitive     NITROFURANTOIN <=16 SENSITIVE Sensitive     TRIMETH/SULFA >=320 RESISTANT Resistant     AMPICILLIN/SULBACTAM >=32 RESISTANT Resistant     PIP/TAZO <=4 SENSITIVE Sensitive     * 60,000 COLONIES/mL ESCHERICHIA COLI  Resp Panel by RT-PCR (Flu A&B, Covid) Nasopharyngeal Swab     Status: None   Collection Time: 04/20/20 10:57 PM   Specimen: Nasopharyngeal Swab; Nasopharyngeal(NP) swabs in vial transport medium  Result Value Ref Range Status   SARS Coronavirus 2 by RT PCR NEGATIVE NEGATIVE Final    Comment: (NOTE) SARS-CoV-2 target nucleic acids are NOT DETECTED.  The SARS-CoV-2 RNA is generally detectable in upper respiratory specimens during the acute phase of infection. The lowest concentration of SARS-CoV-2 viral copies this assay can detect  is 138 copies/mL. A negative result does not preclude SARS-Cov-2 infection and should not be used as the sole basis for treatment or other patient management decisions. A negative result may occur with  improper specimen collection/handling, submission of specimen other than nasopharyngeal swab, presence of viral mutation(s) within the areas targeted by this assay, and inadequate number of viral copies(<138 copies/mL). A negative result must be combined with clinical observations, patient history, and epidemiological information. The expected result is Negative.  Fact Sheet for Patients:  BloggerCourse.com  Fact Sheet for Healthcare Providers:  SeriousBroker.it  This test is no t yet approved or cleared by the [place] FDA and  has been authorized for detection and/or diagnosis of SARS-CoV-2 by FDA under an Emergency Use Authorization (EUA). This EUA will remain  in effect (meaning this test can be used) for the duration of the COVID-19 declaration under Section 564(b)(1) of the Act, 21 U.S.C.section 360bbb-3(b)(1), unless the authorization is terminated  or revoked sooner.       Influenza A by PCR NEGATIVE NEGATIVE Final   Influenza B by PCR NEGATIVE NEGATIVE Final    Comment: (NOTE) The Xpert Xpress SARS-CoV-2/FLU/RSV plus assay is intended as an aid in the diagnosis of influenza from Nasopharyngeal swab specimens and should not be used as a sole basis for treatment. Nasal washings and aspirates are unacceptable for Xpert Xpress SARS-CoV-2/FLU/RSV testing.  Fact Sheet for Patients: BloggerCourse.com  Fact Sheet for Healthcare Providers: SeriousBroker.it  This test is not yet approved or cleared by the [place] FDA and has been authorized for detection and/or diagnosis of SARS-CoV-2 by FDA under an Emergency Use Authorization (EUA). This EUA will remain in effect  (meaning this test can be used) for the duration of the COVID-19 declaration under Section 564(b)(1) of the Act, 21 U.S.C. section 360bbb-3(b)(1), unless the authorization is terminated or revoked.  Performed at [facility], [address] [place], [place] [address]     [x]  Treated with Bactrim DS, organism resistant to prescribed antimicrobial []  Patient discharged originally without antimicrobial agent and treatment is now indicated  New antibiotic prescription: Cipro 500mg  bid x 14 days  ED Provider: , PA   04/23/2020, 9:17 AM Clinical Pharmacist [phone]

## 2020-12-28 ENCOUNTER — Emergency Department (HOSPITAL_COMMUNITY)
Admission: EM | Admit: 2020-12-28 | Discharge: 2020-12-28 | Disposition: A | Discharge status: Home / Self Care | Payer: BC Managed Care – PPO | Attending: Emergency Medicine | Admitting: Emergency Medicine

## 2020-12-28 ENCOUNTER — Other Ambulatory Visit: Payer: Self-pay

## 2020-12-28 ENCOUNTER — Emergency Department (HOSPITAL_COMMUNITY): Payer: BC Managed Care – PPO

## 2020-12-28 DIAGNOSIS — Z91040 Latex allergy status: Secondary | ICD-10-CM | POA: Insufficient documentation

## 2020-12-28 DIAGNOSIS — B9689 Other specified bacterial agents as the cause of diseases classified elsewhere: Secondary | ICD-10-CM | POA: Diagnosis not present

## 2020-12-28 DIAGNOSIS — Z87891 Personal history of nicotine dependence: Secondary | ICD-10-CM | POA: Diagnosis not present

## 2020-12-28 DIAGNOSIS — R112 Nausea with vomiting, unspecified: Secondary | ICD-10-CM | POA: Diagnosis not present

## 2020-12-28 DIAGNOSIS — N9489 Other specified conditions associated with female genital organs and menstrual cycle: Secondary | ICD-10-CM | POA: Insufficient documentation

## 2020-12-28 DIAGNOSIS — N390 Urinary tract infection, site not specified: Secondary | ICD-10-CM | POA: Insufficient documentation

## 2020-12-28 DIAGNOSIS — R1030 Lower abdominal pain, unspecified: Secondary | ICD-10-CM | POA: Diagnosis present

## 2020-12-28 DIAGNOSIS — R140 Abdominal distension (gaseous): Secondary | ICD-10-CM | POA: Diagnosis not present

## 2020-12-28 LAB — COMPREHENSIVE METABOLIC PANEL
ALT: 25 U/L (ref 0–44)
AST: 19 U/L (ref 15–41)
Albumin: 4.7 g/dL (ref 3.5–5.0)
Alkaline Phosphatase: 78 U/L (ref 38–126)
Anion gap: 9 (ref 5–15)
BUN: 20 mg/dL (ref 6–20)
CO2: 22 mmol/L (ref 22–32)
Calcium: 9.2 mg/dL (ref 8.9–10.3)
Chloride: 107 mmol/L (ref 98–111)
Creatinine, Ser: 0.97 mg/dL (ref 0.44–1.00)
GFR, Estimated: >60 mL/min (ref >60)
Glucose, Bld: 104 mg/dL — ABNORMAL HIGH (ref 70–99)
Potassium: 3.5 mmol/L (ref 3.5–5.1)
Sodium: 138 mmol/L (ref 135–145)
Total Bilirubin: 0.5 mg/dL (ref 0.3–1.2)
Total Protein: 8.6 g/dL — ABNORMAL HIGH (ref 6.5–8.1)

## 2020-12-28 LAB — CBC
HCT: 44.1 % (ref 36.0–46.0)
Hemoglobin: 14.5 g/dL (ref 12.0–15.0)
MCH: 30.4 pg (ref 26.0–34.0)
MCHC: 32.9 g/dL (ref 30.0–36.0)
MCV: 92.5 fL (ref 80.0–100.0)
Platelets: 381 10*3/uL (ref 150–400)
RBC: 4.77 MIL/uL (ref 3.87–5.11)
RDW: 12.1 % (ref 11.5–15.5)
WBC: 10 10*3/uL (ref 4.0–10.5)
nRBC: 0 % (ref 0.0–0.2)

## 2020-12-28 LAB — URINALYSIS, ROUTINE W REFLEX MICROSCOPIC
Bilirubin Urine: NEGATIVE
Glucose, UA: NEGATIVE mg/dL
Ketones, ur: NEGATIVE mg/dL
Nitrite: NEGATIVE
Protein, ur: NEGATIVE mg/dL
Specific Gravity, Urine: 1.03 (ref 1.005–1.030)
pH: 6 (ref 5.0–8.0)

## 2020-12-28 LAB — I-STAT BETA HCG BLOOD, ED (MC, WL, AP ONLY): I-stat hCG, quantitative: <5 m[IU]/mL (ref <5)

## 2020-12-28 LAB — LIPASE, BLOOD: Lipase: 26 U/L (ref 11–51)

## 2020-12-28 MED ORDER — CEPHALEXIN 500 MG PO CAPS: 500.0000 mg | ORAL_CAPSULE | Freq: Three times a day (TID) | ORAL | 0 refills | Status: AC

## 2020-12-28 MED ORDER — PHENAZOPYRIDINE HCL 200 MG PO TABS: 200.0000 mg | ORAL_TABLET | Freq: Three times a day (TID) | ORAL | 0 refills | Status: DC

## 2020-12-28 MED ORDER — IOHEXOL 350 MG/ML SOLN
80.0000 mL | Freq: Once | INTRAVENOUS | Status: AC | PRN
  Administered 2020-12-28: 80 mL via INTRAVENOUS

## 2020-12-28 NOTE — ED Triage Notes (Signed)
Patient arrived POV is alert and oriented time 4. Having lower abdominal pain. Sharp and throbbing pain. When she woke up this morning she was sweating and shaking. She put a heating pad and took 3 ibp. Pain went away for a little bit. Pt. vomited at work and pain started to return. Abd is also swollen.

## 2020-12-28 NOTE — ED Provider Notes (Signed)
[facility] EMERGENCY DEPARTMENT Provider Note  CSN: [id number] Arrival date & time: 12/28/20 2015    History Chief Complaint  Patient presents with   Abdominal Pain     Julie [name] is a 30 y.o. female reports she had onset of sharp, cramping, lower abdominal pain 4 days ago, eventually resolved that day, returned yesterday and then again today. Pain does not radiate. Associated with nausea, vomiting, and bloating. She denies fever, but woke up with chills and sweats this morning. She has not had any flank pain, diarrhea, constipation, dysuria, hematuria, vaginal bleeding or discharge. LMP was last week, normal in timing. Does not usually having cramping with menses.    Past Medical History:  Diagnosis Date   Headache    MIGRAINES   Medical history non-contributory    Postpartum care following vaginal delivery (12/27) 05/30/2013    Past Surgical History:  Procedure Laterality Date   DILATION AND EVACUATION N/A 08/20/2016   Procedure: DILATATION AND EVACUATION;  Surgeon: Genia Del, MD;  Location: [facility];  Service: Gynecology;  Laterality: N/A;   NO PAST SURGERIES      Family History  Problem Relation Age of Onset   Asthma Neg Hx    Diabetes Neg Hx    Depression Neg Hx    Heart disease Neg Hx    Hyperlipidemia Neg Hx     Social History   Tobacco Use   Smoking status: Former    Types: Cigarettes    Quit date: 09/01/2012    Years since quitting: 8.3   Smokeless tobacco: Never  Vaping Use   Vaping Use: Never used  Substance Use Topics   Alcohol use: No   Drug use: No     Home Medications Prior to Admission medications   Medication Sig Start Date End Date Taking? Authorizing Provider  cephALEXin (KEFLEX) 500 MG capsule Take 1 capsule (500 mg total) by mouth 3 (three) times daily for 7 days. 12/28/20 01/04/21 Yes Pollyann Savoy, MD  phenazopyridine (PYRIDIUM) 200 MG tablet Take 1 tablet (200 mg total) by mouth 3 (three) times daily. 12/28/20  Yes Pollyann Savoy, MD  acetaminophen (TYLENOL) 500 MG tablet Take 1,000 mg by mouth every 6 (six) hours as needed for mild pain.    [provider]  ibuprofen (ADVIL,MOTRIN) 800 MG tablet Take 1 tablet (800 mg total) by mouth every 6 (six) hours. Patient taking differently: Take 800 mg by mouth every 8 (eight) hours as needed for moderate pain.  02/03/18   Marlinda Mike, CNM  ondansetron (ZOFRAN ODT) 4 MG disintegrating tablet Take 1 tablet (4 mg total) by mouth every 8 (eight) hours as needed for nausea or vomiting. 04/21/20   Petrucelli, Pleas Koch, PA-C     Allergies    Latex   Review of Systems   Review of Systems A comprehensive review of systems was completed and negative except as noted in HPI.    Physical Exam BP 125/82   Pulse 82   Temp 98.8 F (37.1 C) (Oral)   Resp 18   Ht 5\' 3"  (1.6 m)   Wt 65.8 kg   LMP 12/19/2020 (Exact Date)   SpO2 99%   BMI 25.69 kg/m   Physical Exam Vitals and nursing note reviewed.  Constitutional:      Appearance: Normal appearance.  HENT:     Head: Normocephalic and atraumatic.     Nose: Nose normal.     Mouth/Throat:     Mouth: Mucous membranes  are moist.  Eyes:     Extraocular Movements: Extraocular movements intact.     Conjunctiva/sclera: Conjunctivae normal.  Cardiovascular:     Rate and Rhythm: Normal rate.  Pulmonary:     Effort: Pulmonary effort is normal.     Breath sounds: Normal breath sounds.  Abdominal:     General: Abdomen is flat. Bowel sounds are normal. There is no distension.     Palpations: Abdomen is soft. There is no mass.     Tenderness: There is abdominal tenderness (suprapubic). There is no guarding or rebound.  Musculoskeletal:        General: No swelling. Normal range of motion.     Cervical back: Neck supple.  Skin:    General: Skin is warm and dry.  Neurological:     General: No focal deficit present.     Mental Status: She is alert.  Psychiatric:        Mood and Affect: Mood normal.     ED  Results / Procedures / Treatments   Labs (all labs ordered are listed, but only abnormal results are displayed) Labs Reviewed  COMPREHENSIVE METABOLIC PANEL - Abnormal; Notable for the following components:      Result Value   Glucose, Bld 104 (*)    Total Protein 8.6 (*)    All other components within normal limits  URINALYSIS, ROUTINE W REFLEX MICROSCOPIC - Abnormal; Notable for the following components:   APPearance HAZY (*)    Hgb urine dipstick SMALL (*)    Leukocytes,Ua MODERATE (*)    Bacteria, UA RARE (*)    All other components within normal limits  LIPASE, BLOOD  CBC  I-STAT BETA HCG BLOOD, ED ([facility], [facility], [facility] ONLY)    EKG None  Radiology CT Abdomen Pelvis W Contrast  Result Date: 12/28/2020 CLINICAL DATA:  Lower abdominal pain, sharp and throbbing EXAM: CT ABDOMEN AND PELVIS WITH CONTRAST TECHNIQUE: Multidetector CT imaging of the abdomen and pelvis was performed using the standard protocol following bolus administration of intravenous contrast. CONTRAST:  41mL OMNIPAQUE IOHEXOL 350 MG/ML SOLN COMPARISON:  CT 04/21/2020 FINDINGS: Lower chest: Lung bases are clear. Normal heart size. No pericardial effusion. Hepatobiliary: No worrisome focal liver lesions. Smooth liver surface contour. Normal hepatic attenuation. Normal gallbladder and biliary tree. Pancreas: No pancreatic ductal dilatation or surrounding inflammatory changes. Spleen: Normal in size. No concerning splenic lesions. Small accessory splenule. Adrenals/Urinary Tract: Normal adrenals. Kidneys are normally located with symmetric enhancement. No suspicious renal lesion, urolithiasis or hydronephrosis. Mild bladder wall thickening likely related to underdistention. Stomach/Bowel: Distal esophagus, stomach and duodenum are unremarkable. No small bowel thickening or dilatation. Normal appendix in the right lower quadrant coursing medially from the cecal tip. No colonic dilatation or wall thickening. Vascular/Lymphatic: No  significant vascular findings are present. No enlarged abdominal or pelvic lymph nodes. Reproductive: Anteverted uterus. 1.8 cm dominant cyst/follicle seen in the right adnexa. Concerning focal adnexal lesion. Other: No abdominopelvic free air or fluid. No bowel containing hernias. Musculoskeletal: No acute osseous abnormality or suspicious osseous lesion. IMPRESSION: No acute CT abnormality to provide cause for patient's symptoms. 1.8 cm dominant cyst/follicle in the right adnexa. No follow-up imaging recommended. Note: This recommendation does not apply to patients with increased risk (genetic, family history, elevated tumor markers or other high-risk factors) of ovarian cancer. Reference: [facility] 2020 Feb; 17(2):248-254 Electronically Signed   By: Kreg Shropshire M.D.   On: 12/28/2020 21:50    Procedures Procedures  Medications Ordered in the ED Medications  iohexol (OMNIPAQUE) 350 MG/ML injection 80 mL (80 mLs Intravenous Contrast Given 12/28/20 2138)     MDM Rules/Calculators/A&P MDM Labs ordered in triage reviewed, CBC, CMP and Lipase are normal. UA with WBC, LE and bacteria. HCG is neg. Will check CT to rule out other causes of pain such as diverticulitis, appendicitis. Less likely intermittent ovarian torsion.   ED Course  I have reviewed the triage vital signs and the nursing notes.  Pertinent labs & imaging results that were available during my care of the patient were reviewed by me and considered in my medical decision making (see chart for details).  Clinical Course as of 12/28/20 2241  Thu Dec 28, 2020  2210 CT is negative, small ovarian follicle unlikely to be causing pain. Will plan treatment for UTI.  [CS]  2236 Patient remains relatively pain free. Discussed results and plan and she is amenable. Advised to return to the ED for worsening pain, fever, vomiting or any other concerns.  [CS]    Clinical Course User Index [CS] Pollyann Savoy, MD    Final Clinical Impression(s) /  ED Diagnoses Final diagnoses:  Lower urinary tract infectious disease    Rx / DC Orders ED Discharge Orders          Ordered    cephALEXin (KEFLEX) 500 MG capsule  3 times daily        12/28/20 2240    phenazopyridine (PYRIDIUM) 200 MG tablet  3 times daily        12/28/20 2240             Pollyann Savoy, MD 12/28/20 2241

## 2022-01-02 IMAGING — CT CT ABD-PELV W/ CM
2 of 4 series · 16 of 46 positions shown, 18 images · IV contrast (omnipaque)
Comparison: CT 04/21/2020

CLINICAL DATA: Lower abdominal pain, sharp and throbbing

EXAM:
CT ABDOMEN AND PELVIS WITH CONTRAST
TECHNIQUE: Multidetector CT imaging of the abdomen and pelvis was performed
using the standard protocol following bolus administration of
intravenous contrast.
CONTRAST:  80mL OMNIPAQUE IOHEXOL 350 MG/ML SOLN

[Series 2: axial st · axial · 0.75mm/px · z∈[-486,-116]mm · 13 of 84 slices shown, 15 images]
[im 5/84  soft-tissue]
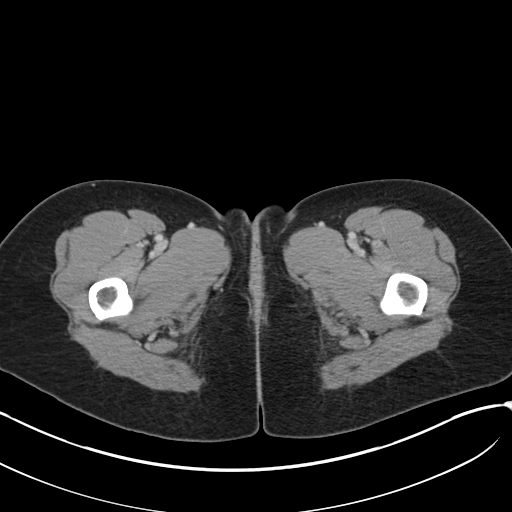
[im 5/84  bone]
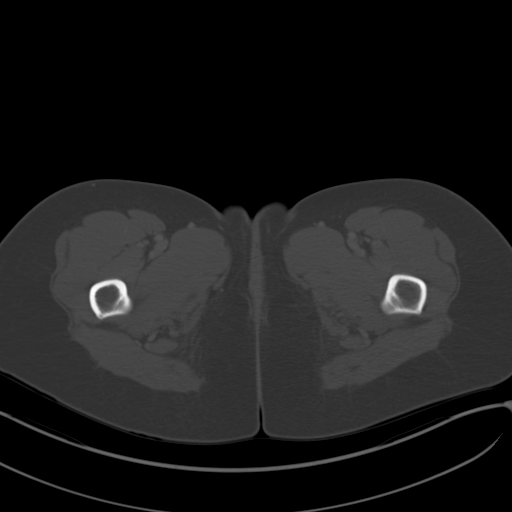
[im 10/84  soft-tissue]
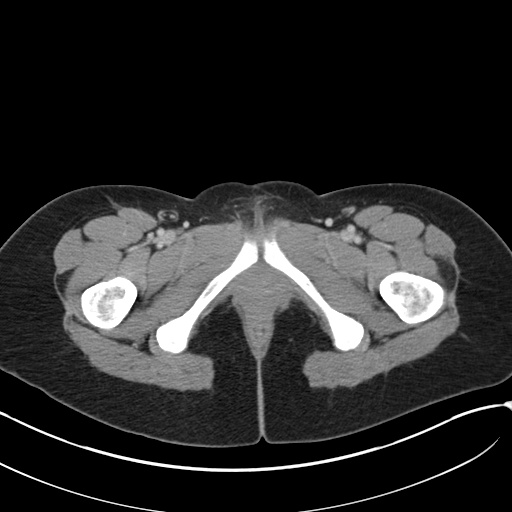
[im 20/84  soft-tissue]
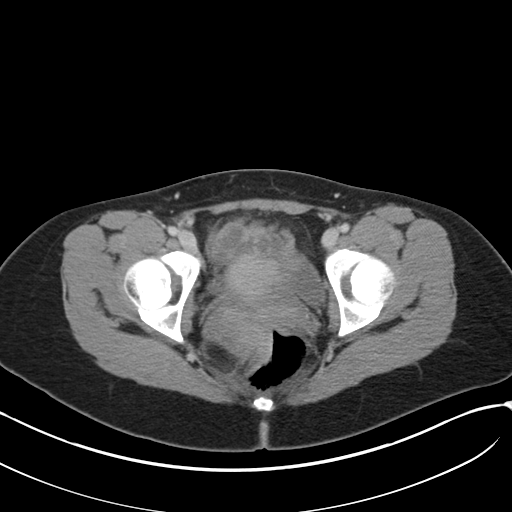
[im 25/84  soft-tissue]
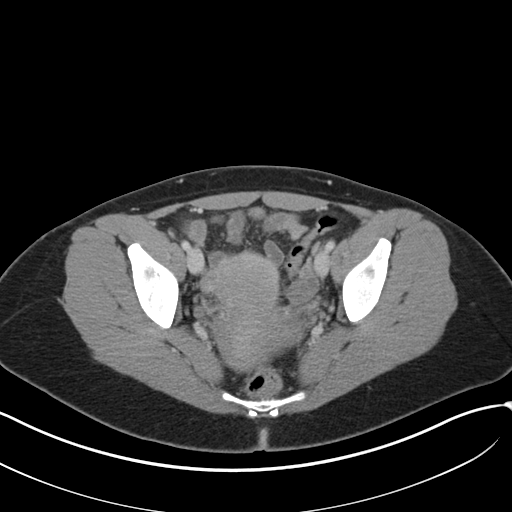
[im 30/84  soft-tissue]
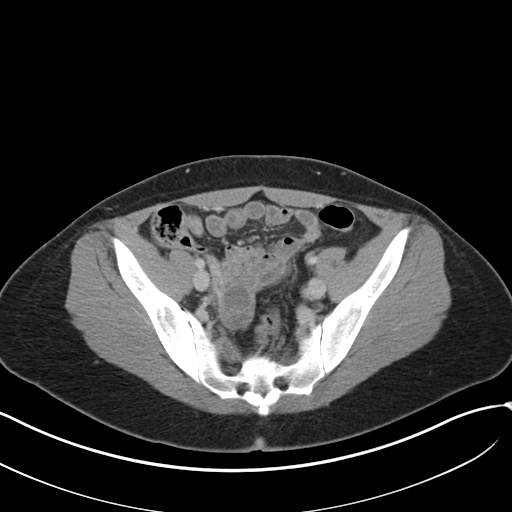
[im 35/84  soft-tissue]
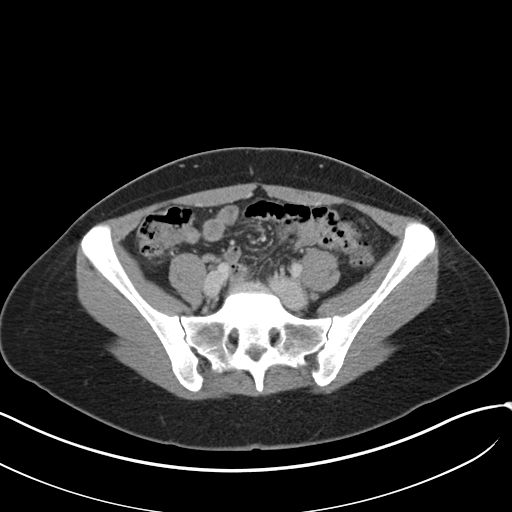
[im 44/84  soft-tissue]
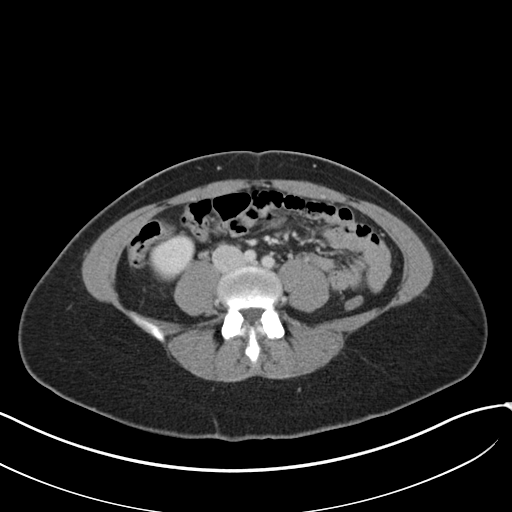
[im 49/84  soft-tissue]
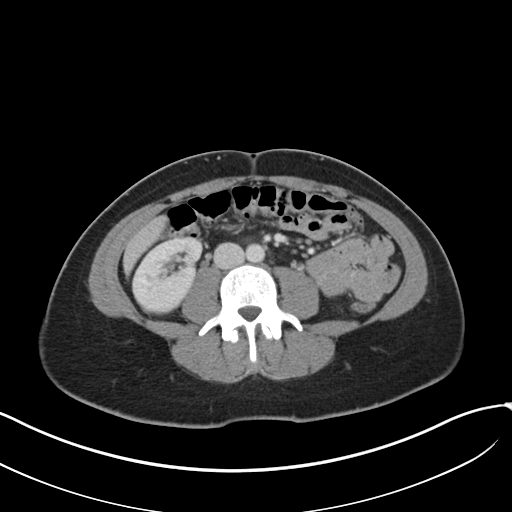
[im 54/84  soft-tissue]
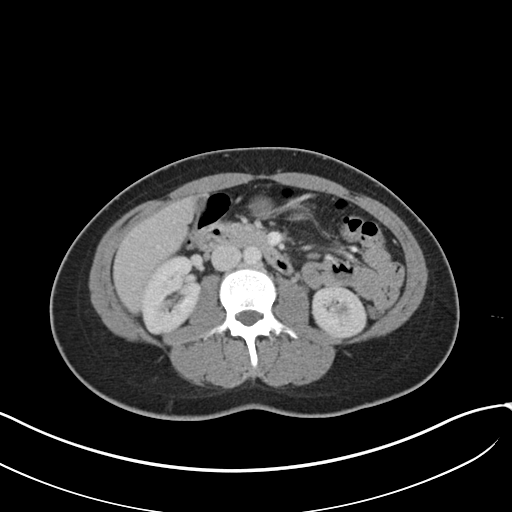
[im 54/84  bone]
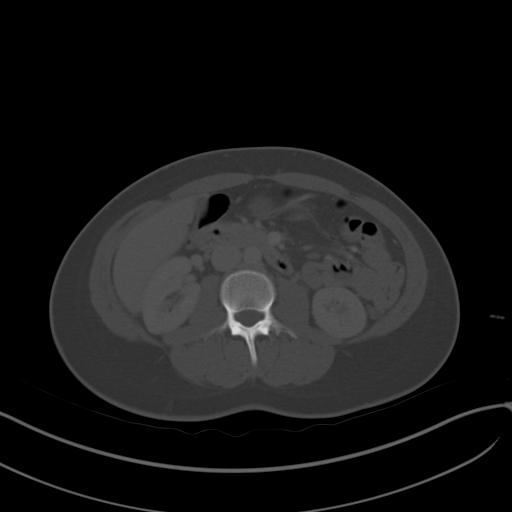
[im 59/84  soft-tissue]
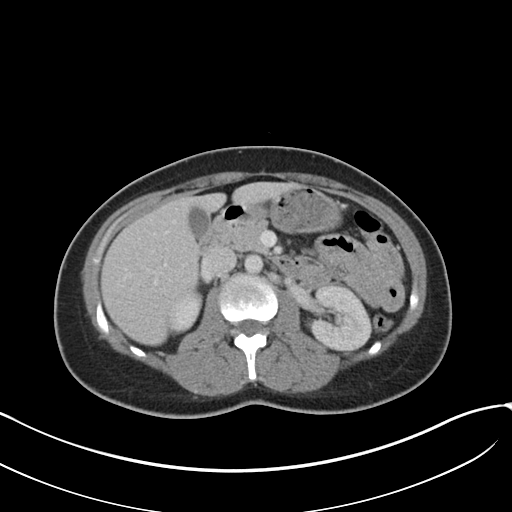
[im 64/84  soft-tissue]
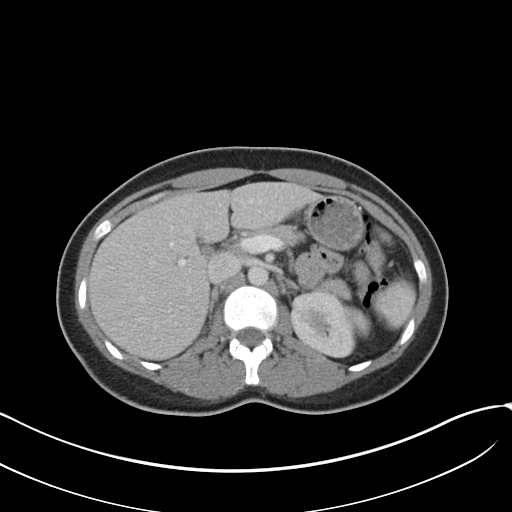
[im 74/84  soft-tissue]
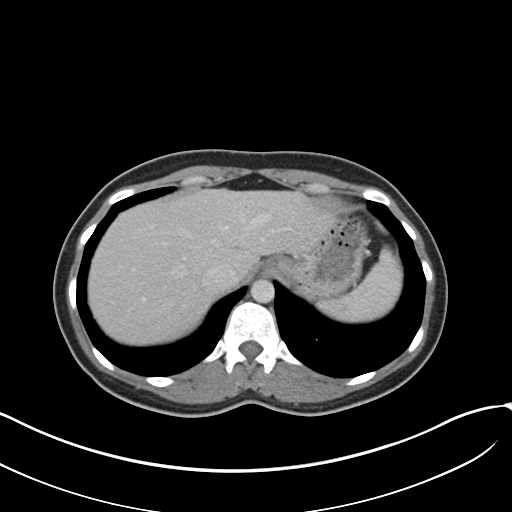
[im 79/84  soft-tissue]
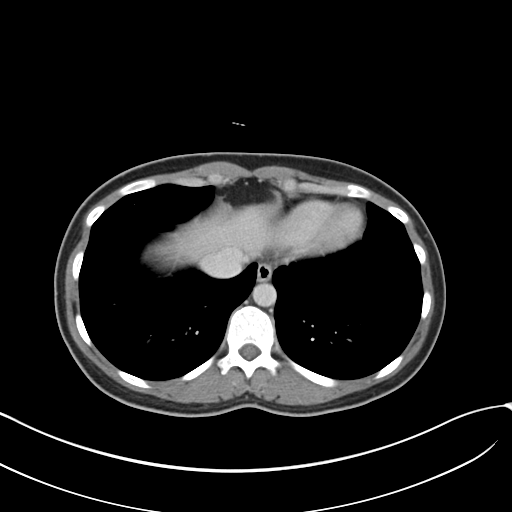

[Series 4: coronal st · coronal · 0.73mm/px · 3 of 110 slices shown]
[im 37/110  soft-tissue]
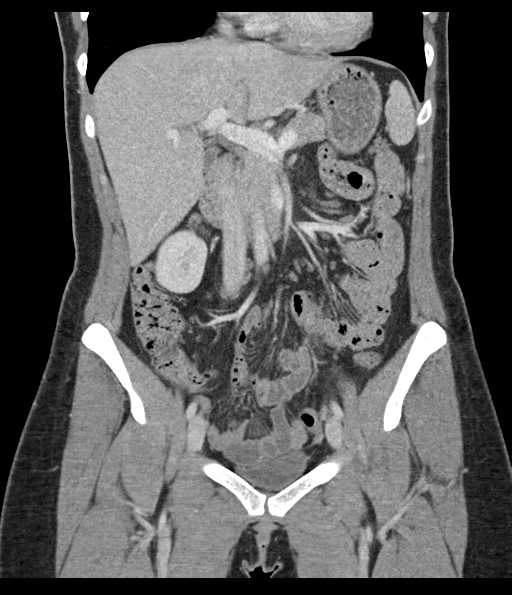
[im 49/110  soft-tissue]
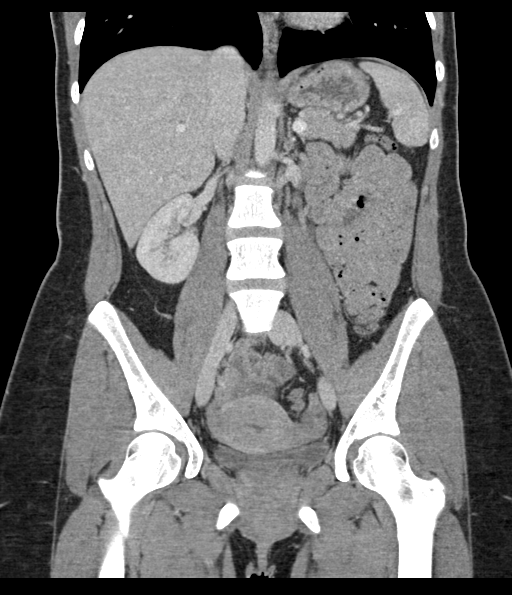
[im 61/110  soft-tissue]
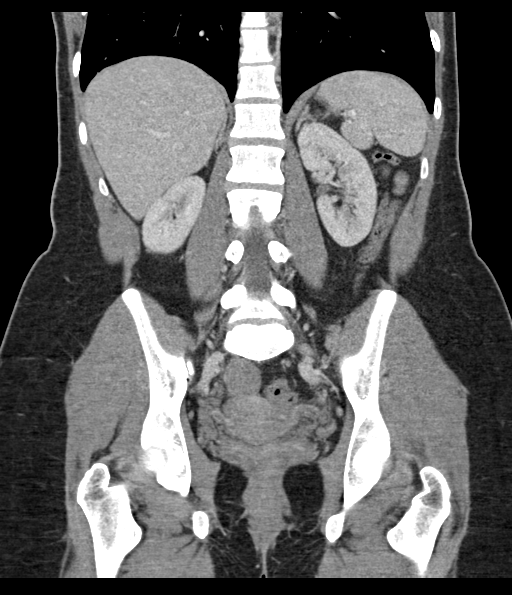

[16 of 46 positions shown; findings below may reference images not displayed]

FINDINGS: Lower chest: Lung bases are clear. Normal heart size. No pericardial
effusion.

Hepatobiliary: No worrisome focal liver lesions. Smooth liver
surface contour. Normal hepatic attenuation. Normal gallbladder and
biliary tree.

Pancreas: No pancreatic ductal dilatation or surrounding
inflammatory changes.

Spleen: Normal in size. No concerning splenic lesions. Small
accessory splenule.

Adrenals/Urinary Tract: Normal adrenals. Kidneys are normally
located with symmetric enhancement. No suspicious renal lesion,
urolithiasis or hydronephrosis. Mild bladder wall thickening likely
related to underdistention.

Stomach/Bowel: Distal esophagus, stomach and duodenum are
unremarkable. No small bowel thickening or dilatation. Normal
appendix in the right lower quadrant coursing medially from the
cecal tip. No colonic dilatation or wall thickening.

Vascular/Lymphatic: No significant vascular findings are present. No
enlarged abdominal or pelvic lymph nodes.

Reproductive: Anteverted uterus. 1.8 cm dominant cyst/follicle seen
in the right adnexa. Concerning focal adnexal lesion.

Other: No abdominopelvic free air or fluid. No bowel containing
hernias.

Musculoskeletal: No acute osseous abnormality or suspicious osseous
lesion.
IMPRESSION: No acute CT abnormality to provide cause for patient's symptoms.

1.8 cm dominant cyst/follicle in the right adnexa. No follow-up
imaging recommended. Note: This recommendation does not apply to
patients with increased risk (genetic, family history, elevated
tumor markers or other high-risk factors) of ovarian cancer.
Reference: JACR [DATE]):[id number]

## 2022-02-26 ENCOUNTER — Emergency Department (HOSPITAL_BASED_OUTPATIENT_CLINIC_OR_DEPARTMENT_OTHER)
Admission: EM | Admit: 2022-02-26 | Discharge: 2022-02-26 | Disposition: A | Discharge status: Home / Self Care | Payer: BC Managed Care – PPO | Attending: Emergency Medicine | Admitting: Emergency Medicine

## 2022-02-26 ENCOUNTER — Other Ambulatory Visit: Payer: Self-pay

## 2022-02-26 ENCOUNTER — Emergency Department (HOSPITAL_BASED_OUTPATIENT_CLINIC_OR_DEPARTMENT_OTHER): Payer: BC Managed Care – PPO

## 2022-02-26 ENCOUNTER — Encounter (HOSPITAL_BASED_OUTPATIENT_CLINIC_OR_DEPARTMENT_OTHER): Payer: Self-pay | Admitting: Emergency Medicine

## 2022-02-26 DIAGNOSIS — M545 Low back pain, unspecified: Secondary | ICD-10-CM | POA: Diagnosis not present

## 2022-02-26 DIAGNOSIS — N83202 Unspecified ovarian cyst, left side: Secondary | ICD-10-CM | POA: Diagnosis not present

## 2022-02-26 DIAGNOSIS — W109XXA Fall (on) (from) unspecified stairs and steps, initial encounter: Secondary | ICD-10-CM | POA: Insufficient documentation

## 2022-02-26 DIAGNOSIS — W108XXA Fall (on) (from) other stairs and steps, initial encounter: Secondary | ICD-10-CM

## 2022-02-26 LAB — PREGNANCY, URINE: Preg Test, Ur: NEGATIVE

## 2022-02-26 MED ORDER — METHOCARBAMOL 500 MG PO TABS: 500.0000 mg | ORAL_TABLET | Freq: Three times a day (TID) | ORAL | 0 refills | Status: DC | PRN

## 2022-02-26 MED ORDER — MELOXICAM 7.5 MG PO TABS: 7.5000 mg | ORAL_TABLET | Freq: Every day | ORAL | 0 refills | Status: AC

## 2022-02-26 MED ORDER — KETOROLAC TROMETHAMINE 30 MG/ML IJ SOLN
30.0000 mg | Freq: Once | INTRAMUSCULAR | Status: AC
  Administered 2022-02-26: 30 mg via INTRAMUSCULAR
  Filled 2022-02-26: qty 1

## 2022-02-26 MED ORDER — DICLOFENAC SODIUM 1 % EX GEL: 2.0000 g | Freq: Four times a day (QID) | CUTANEOUS | 0 refills | Status: DC | PRN

## 2022-02-26 NOTE — ED Triage Notes (Signed)
Pt c/o back pain s/p fall down 15 steps on Saturday; sts she is vomiting d/t the pain

## 2022-02-26 NOTE — ED Provider Notes (Signed)
Emergency Department Provider Note   I have reviewed the triage vital signs and the nursing notes.   HISTORY  Chief Complaint Back Pain   HPI Julie [name] is a 31 y.o. female presents to the ED with low back pain after fall down multiple steps on Saturday. Patient reports losing footing at the top of stairs and falling backwards hitting her back and having pain. No numbness/weakness. No arm/leg pain. No CP or abdominal pain. Notes that some vomiting has occurred due to pain.    Past Medical History:  Diagnosis Date   Headache    MIGRAINES   Medical history non-contributory    Postpartum care following vaginal delivery (12/27) 05/30/2013    Review of Systems  Constitutional: No fever/chills Cardiovascular: Denies chest pain. Respiratory: Denies shortness of breath. Gastrointestinal: No abdominal pain.  Musculoskeletal: Positive back pain. No numbness/weakness.   ____________________________________________   PHYSICAL EXAM:  VITAL SIGNS: ED Triage Vitals  Enc Vitals Group     BP 02/26/22 1717 116/80     Pulse Rate 02/26/22 1717 96     Resp 02/26/22 1717 18     Temp 02/26/22 1717 98.6 F (37 C)     Temp Source 02/26/22 1717 Oral     SpO2 02/26/22 1717 100 %     Weight 02/26/22 1717 155 lb (70.3 kg)     Height 02/26/22 1717 5\' 6"  (1.676 m)   Constitutional: Alert and oriented. Well appearing and in no acute distress. Eyes: Conjunctivae are normal. Head: Atraumatic. Nose: No congestion/rhinnorhea. Mouth/Throat: Mucous membranes are moist.  Neck: No stridor. No cervical spine tenderness to palpation. Cardiovascular: Normal rate, regular rhythm. Good peripheral circulation. Grossly normal heart sounds.   Respiratory: Normal respiratory effort.  No retractions. Lungs CTAB. Gastrointestinal: Soft and nontender. No distention.  Musculoskeletal: No lower extremity tenderness nor edema. No gross deformities of extremities. Normal ROM of the bilateral upper/lower  extremities. Diffuse lower back pain both midline and paraspinal musculature. No bruising.  Neurologic:  Normal speech and language. No gross focal neurologic deficits are appreciated.  Skin:  Skin is warm, dry and intact. No rash noted.  ____________________________________________   LABS (all labs ordered are listed, but only abnormal results are displayed)  Labs Reviewed  PREGNANCY, URINE   ____________________________________________  RADIOLOGY  DG Thoracic Spine 2 View  Result Date: 02/26/2022 CLINICAL DATA:  Fall, back pain EXAM: THORACIC SPINE 2 VIEWS COMPARISON:  None Available. FINDINGS: There is no evidence of thoracic spine fracture. Alignment is normal. No other significant bone abnormalities are identified. IMPRESSION: Negative. Electronically Signed   By: Charlett Nose M.D.   On: 02/26/2022 20:32   DG Lumbar Spine Complete  Result Date: 02/26/2022 CLINICAL DATA:  Fall, back pain EXAM: LUMBAR SPINE - COMPLETE 4+ VIEW COMPARISON:  None Available. FINDINGS: There is no evidence of lumbar spine fracture. Alignment is normal. Intervertebral disc spaces are maintained. IMPRESSION: Negative. Electronically Signed   By: Charlett Nose M.D.   On: 02/26/2022 20:32    ____________________________________________   PROCEDURES  Procedure(s) performed:   Procedures  None ____________________________________________   INITIAL IMPRESSION / ASSESSMENT AND PLAN / ED COURSE  Pertinent labs & imaging results that were available during my care of the patient were reviewed by me and considered in my medical decision making (see chart for details).   This patient is Presenting for Evaluation of back pain, which does require a range of treatment options, and is a complaint that involves a high risk of morbidity  and mortality.  The Differential Diagnoses includes but is not exclusive to musculoskeletal back pain, renal colic, urinary tract infection, pyelonephritis, intra-abdominal  causes of back pain, aortic aneurysm or dissection, cauda equina syndrome, sciatica, lumbar disc disease, thoracic disc disease, etc.    Clinical Laboratory Tests Ordered, included pregnancy test which is negative.   Radiologic Tests Ordered, included plain films of thoracic and lumbar spine along with CT chest/abd/pelvis w/o contrast. I independently interpreted the images and agree with radiology interpretation.    Medical Decision Making: Summary:  Patient presents to the ED with lower back and some mid-back pain. On exam patient is uncomfortable but in no distress. Plain films negative. Plan for CT for more detailed imaging.   Reevaluation with update and discussion with patient. Discussed CT findings and recommendations for outpatient follow up regarding cyst. Doubt ovarian torsion clinically and with reproducible lower back pain. Plan for outpatient follow up and pain mgmt at home.   Disposition: discharge  ____________________________________________  FINAL CLINICAL IMPRESSION(S) / ED DIAGNOSES  Final diagnoses:  Acute bilateral low back pain without sciatica  Fall down stairs, initial encounter  Cyst of left ovary     NEW OUTPATIENT MEDICATIONS STARTED DURING THIS VISIT:  Discharge Medication List as of 02/26/2022 10:35 PM     START taking these medications   Details  diclofenac Sodium (VOLTAREN) 1 % GEL Apply 2 g topically 4 (four) times daily as needed., Starting Tue 02/26/2022, Normal    meloxicam (MOBIC) 7.5 MG tablet Take 1 tablet (7.5 mg total) by mouth daily for 5 days., Starting Tue 02/26/2022, Until Sun 03/03/2022, Normal    methocarbamol (ROBAXIN) 500 MG tablet Take 1 tablet (500 mg total) by mouth every 8 (eight) hours as needed for muscle spasms., Starting Tue 02/26/2022, Normal        Note:  This document was prepared using Dragon voice recognition software and may include unintentional dictation errors.  Nanda Quinton, MD, [facility]     Sherrica Niehaus, [facility], MD 03/04/22 [phone]

## 2022-02-26 NOTE — Discharge Instructions (Signed)
You are seen in the emergency room today with back pain after fall.  There are no broken bones that we are seeing on the CT scan.  You do have a cyst on your left ovary and the radiology department is recommending nonemergent ultrasound with your OB/GYN.  Please call their office tomorrow to schedule this.  If you develop sudden worsening pain in your lower abdomen or pelvis please return to the emergency department immediately.  I have called in some medicines to help with pain.  Robaxin may cause some drowsiness and you cannot drive while taking it.  While taking meloxicam, you can take Tylenol but I would not take additional ibuprofen, Aleve, aspirin.

## 2023-01-06 ENCOUNTER — Ambulatory Visit
Admission: EM | Admit: 2023-01-06 | Discharge: 2023-01-06 | Disposition: A | Discharge status: Home / Self Care | Payer: BC Managed Care – PPO | Attending: Internal Medicine | Admitting: Internal Medicine

## 2023-01-06 ENCOUNTER — Ambulatory Visit (INDEPENDENT_AMBULATORY_CARE_PROVIDER_SITE_OTHER): Payer: BC Managed Care – PPO

## 2023-01-06 DIAGNOSIS — S93602A Unspecified sprain of left foot, initial encounter: Secondary | ICD-10-CM | POA: Diagnosis not present

## 2023-01-06 DIAGNOSIS — M79672 Pain in left foot: Secondary | ICD-10-CM

## 2023-01-06 MED ORDER — KETOROLAC TROMETHAMINE 30 MG/ML IJ SOLN
30.0000 mg | Freq: Once | INTRAMUSCULAR | Status: AC
  Administered 2023-01-06: 30 mg via INTRAMUSCULAR

## 2023-01-06 NOTE — ED Triage Notes (Signed)
Pt c/o pain to L lateral ankle that began last night. Pt reports she was walking into work when she heard a pop and had immediate pain to L ankle. Reports pain worsened overnight. Pain is constant, but worsened when bearing weight. Also reports pain intermittently shoots up into her leg. No hx of previous injury to ankle.

## 2023-01-06 NOTE — ED Provider Notes (Addendum)
[facility]  Note:  This document was prepared using Dragon voice recognition software and may include unintentional dictation errors.  MRN: [id number] DOB: Jul 17, 1990 DATE: 01/06/23   Subjective:  Chief Complaint:  Chief Complaint  Patient presents with   Ankle Pain    HPI: Julie [name] is a 32 y.o. female presenting for left foot pain for less than 1 day.  Patient states yesterday she was getting out of her car and walking into work when she suddenly heard a pop in her left foot.  She reports no known injury.  She did not trip or fall, but suddenly heard a pop and started experiencing pain in her left foot.  Pain is worse with walking and weightbearing.  Patient had to walk on her toes at work throughout the night.  She has not taken anything for her symptoms.  No known prior surgeries or injuries to the foot. Denies fever, nausea/vomiting, numbness/tingling. Endorses left foot pain. Presents NAD.  Prior to Admission medications   Medication Sig Start Date End Date Taking? Authorizing Provider  acetaminophen (TYLENOL) 500 MG tablet Take 1,000 mg by mouth every 6 (six) hours as needed for mild pain.    [provider]  diclofenac Sodium (VOLTAREN) 1 % GEL Apply 2 g topically 4 (four) times daily as needed. 02/26/22   Long, Arlyss Repress, MD  ibuprofen (ADVIL,MOTRIN) 800 MG tablet Take 1 tablet (800 mg total) by mouth every 6 (six) hours. Patient taking differently: Take 800 mg by mouth every 8 (eight) hours as needed for moderate pain.  02/03/18   Marlinda Mike, CNM  methocarbamol (ROBAXIN) 500 MG tablet Take 1 tablet (500 mg total) by mouth every 8 (eight) hours as needed for muscle spasms. 02/26/22   Long, Arlyss Repress, MD  ondansetron (ZOFRAN ODT) 4 MG disintegrating tablet Take 1 tablet (4 mg total) by mouth every 8 (eight) hours as needed for nausea or vomiting. 04/21/20   Petrucelli, Samantha R, PA-C  phenazopyridine (PYRIDIUM) 200 MG tablet Take 1 tablet (200 mg total) by mouth 3  (three) times daily. 12/28/20   Pollyann Savoy, MD     Allergies  Allergen Reactions   Latex Hives, Itching, Swelling and Rash    History:   Past Medical History:  Diagnosis Date   Headache    MIGRAINES   Medical history non-contributory    Postpartum care following vaginal delivery (12/27) 05/30/2013     Past Surgical History:  Procedure Laterality Date   DILATION AND EVACUATION N/A 08/20/2016   Procedure: DILATATION AND EVACUATION;  Surgeon: Genia Del, MD;  Location: [facility];  Service: Gynecology;  Laterality: N/A;   NO PAST SURGERIES      Family History  Problem Relation Age of Onset   Asthma Neg Hx    Diabetes Neg Hx    Depression Neg Hx    Heart disease Neg Hx    Hyperlipidemia Neg Hx     Social History   Tobacco Use   Smoking status: Former    Current packs/day: 0.00    Types: Cigarettes    Quit date: 09/01/2012    Years since quitting: 10.3   Smokeless tobacco: Never  Vaping Use   Vaping status: Never Used  Substance Use Topics   Alcohol use: No   Drug use: No    Review of Systems  Constitutional:  Negative for fever.  Gastrointestinal:  Negative for nausea and vomiting.  Musculoskeletal:  Positive for arthralgias.  Neurological:  Negative for numbness.  Objective:   Vitals: BP 131/87 (BP Location: Right Arm)   Pulse 81   Temp 99.3 F (37.4 C) (Oral)   Resp 18   LMP 12/08/2022 (Approximate)   SpO2 98%   Physical Exam Constitutional:      General: She is not in acute distress.    Appearance: Normal appearance. She is well-developed and normal weight. She is not ill-appearing or toxic-appearing.  HENT:     Head: Normocephalic and atraumatic.  Cardiovascular:     Rate and Rhythm: Normal rate and regular rhythm.     Pulses:          Dorsalis pedis pulses are 2+ on the right side and 2+ on the left side.     Heart sounds: Normal heart sounds.  Pulmonary:     Effort: Pulmonary effort is normal.     Breath sounds: Normal  breath sounds.     Comments: Clear to auscultation bilaterally  Abdominal:     General: Bowel sounds are normal.     Palpations: Abdomen is soft.     Tenderness: There is no abdominal tenderness. There is no right CVA tenderness or left CVA tenderness.  Musculoskeletal:     Right ankle: Normal.     Left ankle: Normal.     Right foot: Normal.     Left foot: Decreased range of motion. Bony tenderness present. Normal pulse.       Legs:     Comments: Tenderness to palpation along left fifth metatarsal.  Decreased range of motion due to pain with plantarflexion, walking, weightbearing.  Neurovascular intact.  No warmth, erythema, discharge.  Skin:    General: Skin is warm and dry.  Neurological:     General: No focal deficit present.     Mental Status: She is alert.  Psychiatric:        Mood and Affect: Mood and affect normal.     Results:  Labs: No results found for this or any previous visit (from the past 24 hour(s)).  Radiology: DG Foot Complete Left  Result Date: 01/06/2023 CLINICAL DATA:  Left foot pain. Heard a pop and had immediate pain to left ankle. EXAM: LEFT FOOT - COMPLETE 3+ VIEW COMPARISON:  None Available. FINDINGS: Normal bone mineralization. Joint spaces are preserved. No acute fracture is seen. No dislocation. IMPRESSION: Normal left foot radiographs. Electronically Signed   By: Neita Garnet M.D.   On: 01/06/2023 11:33     UC Course/Treatments:  Procedures: Procedures   Medications Ordered in UC: Medications  ketorolac (TORADOL) 30 MG/ML injection 30 mg (30 mg Intramuscular Given 01/06/23 1105)     Assessment and Plan :     ICD-10-CM   1. Sprain of left foot, initial encounter  S93.602A     2. Left foot pain  M79.672      Sprain of left foot, initial encounter Afebrile, nontoxic-appearing, NAD. VSS. DDX includes but not limited to: Sprain, fracture, contusion Imaging was unremarkable today in office.  Suspect sprain.  Recommend Ace wrap and RICE  regimen.  She was given an injection of Toradol 30 mg IM today in office to help with the pain.  Recommend she avoid NSAIDs for the rest the day given her injection today in the office.  She can alternate Tylenol and ibuprofen as needed for the pain. Strict ED precautions were given and patient verbalized understanding.  Left foot pain Afebrile, nontoxic-appearing, NAD. VSS. DDX includes but not limited to: Sprain, fracture, contusion Imaging was unremarkable today  in office.  Suspect sprain.  Recommend Ace wrap and RICE regimen.  She was given an injection of Toradol 30 mg IM today in office to help with the pain.  Recommend she avoid NSAIDs for the rest the day given her injection today in the office.  She can alternate Tylenol and ibuprofen as needed for the pain. Strict ED precautions were given and patient verbalized understanding.   ED Discharge Orders     None        PDMP not reviewed this encounter.     Suleyma Wafer P, PA-C 01/06/23 1135    Clydell Sposito P, PA-C 01/06/23 1135

## 2023-01-06 NOTE — Discharge Instructions (Addendum)
Your x-rays showed no broken bones or dislocations. They were sent to a radiologist for further evaluation and we will call you if the radiologist sees anything different.   Recommend rest, ice, compression, and elevation. You can alternate Tylenol and NSAIDs (Ibuprofen, Alleve) as needed for pain. I recommend starting the NSAIDs tomorrow given your injection of Toradol today in office.  It is very important for you to pay attention to any new symptoms or worsening of your current condition. Please go directly to the Emergency Department immediately should you begin to feel worse in any way or have any of the following symptoms: fevers, increased pain, increased swelling, increased redness, or color changes.

## 2023-03-25 ENCOUNTER — Ambulatory Visit
Admission: EM | Admit: 2023-03-25 | Discharge: 2023-03-25 | Disposition: A | Discharge status: Home / Self Care | Payer: BC Managed Care – PPO | Attending: Emergency Medicine | Admitting: Emergency Medicine

## 2023-03-25 DIAGNOSIS — M67932 Unspecified disorder of synovium and tendon, left forearm: Secondary | ICD-10-CM

## 2023-03-25 MED ORDER — METHYLPREDNISOLONE 4 MG PO TBPK: ORAL_TABLET | ORAL | 0 refills | Status: DC

## 2023-03-25 NOTE — ED Provider Notes (Signed)
[name]    CSN: [id number] Arrival date & time: 03/25/23  1115    HISTORY   Chief Complaint  Patient presents with   Wrist Pain   HPI Julie [name] is a pleasant, 32 y.o. female who presents to urgent care today. Pt c/o pain and swelling to L wrist and forearm.  Pt states that yesterday she was carrying boxes at work and began to notice some pain in the L wrist. She she continue to work third shift Solicitor at [employer] during which time the pain worsened, she began to notice swelling on her forearm near her wrist and developed shooting pain traveling up arm.  States pain is worse with movement, reports decreased grip strength and limited range of motion secondary to pain.  Patient denies prior injury to her left wrist, history of ever breaking a bone.  The history is provided by the patient.   Past Medical History:  Diagnosis Date   Headache    MIGRAINES   Medical history non-contributory    Postpartum care following vaginal delivery (12/27) 05/30/2013   Patient Active Problem List   Diagnosis Date Noted   Postpartum care following vaginal delivery (9/1) 02/02/2018   Normal labor 02/01/2018   Past Surgical History:  Procedure Laterality Date   DILATION AND EVACUATION N/A 08/20/2016   Procedure: DILATATION AND EVACUATION;  Surgeon: Genia Del, MD;  Location: [facility];  Service: Gynecology;  Laterality: N/A;   NO PAST SURGERIES     OB History     Gravida  3   Para  2   Term  2   Preterm      AB  1   Living  2      SAB      IAB  1   Ectopic      Multiple  0   Live Births  2          Home Medications    Prior to Admission medications   Medication Sig Start Date End Date Taking? Authorizing Provider  acetaminophen (TYLENOL) 500 MG tablet Take 1,000 mg by mouth every 6 (six) hours as needed for mild pain.    [provider]  diclofenac Sodium (VOLTAREN) 1 % GEL Apply 2 g topically 4 (four) times daily  as needed. 02/26/22   Long, Arlyss Repress, MD  ibuprofen (ADVIL,MOTRIN) 800 MG tablet Take 1 tablet (800 mg total) by mouth every 6 (six) hours. Patient taking differently: Take 800 mg by mouth every 8 (eight) hours as needed for moderate pain.  02/03/18   Marlinda Mike, CNM  methocarbamol (ROBAXIN) 500 MG tablet Take 1 tablet (500 mg total) by mouth every 8 (eight) hours as needed for muscle spasms. 02/26/22   Long, Arlyss Repress, MD  ondansetron (ZOFRAN ODT) 4 MG disintegrating tablet Take 1 tablet (4 mg total) by mouth every 8 (eight) hours as needed for nausea or vomiting. 04/21/20   Petrucelli, Samantha R, PA-C  phenazopyridine (PYRIDIUM) 200 MG tablet Take 1 tablet (200 mg total) by mouth 3 (three) times daily. 12/28/20   Pollyann Savoy, MD    Family History Family History  Problem Relation Age of Onset   Asthma Neg Hx    Diabetes Neg Hx    Depression Neg Hx    Heart disease Neg Hx    Hyperlipidemia Neg Hx    Social History Social History   Tobacco Use   Smoking status: Former    Current packs/day: 0.00  Types: Cigarettes    Quit date: 09/01/2012    Years since quitting: 10.5   Smokeless tobacco: Never  Vaping Use   Vaping status: Never Used  Substance Use Topics   Alcohol use: No   Drug use: No   Allergies   Latex  Review of Systems Review of Systems Pertinent findings revealed after performing a 14 point review of systems has been noted in the history of present illness.  Physical Exam Vital Signs BP 132/89 (BP Location: Right Arm)   Pulse 70   Temp 98.3 F (36.8 C) (Oral)   Resp 18   SpO2 98%   No data found.  Physical Exam Vitals and nursing note reviewed.  Constitutional:      General: She is awake. She is not in acute distress.    Appearance: Normal appearance.  HENT:     Head: Normocephalic and atraumatic.  Eyes:     Pupils: Pupils are equal, round, and reactive to light.  Cardiovascular:     Rate and Rhythm: Normal rate and regular rhythm.  Pulmonary:      Effort: Pulmonary effort is normal.     Breath sounds: Normal breath sounds.  Musculoskeletal:     Left forearm: Swelling and tenderness present. No edema, deformity or bony tenderness.     Left wrist: Tenderness present. No swelling, deformity, effusion, bony tenderness, snuff box tenderness or crepitus. Decreased range of motion.     Cervical back: Normal range of motion and neck supple.  Skin:    General: Skin is warm and dry.  Neurological:     General: No focal deficit present.     Mental Status: She is alert and oriented to person, place, and time. Mental status is at baseline.  Psychiatric:        Mood and Affect: Mood normal.        Behavior: Behavior normal. Behavior is cooperative.        Thought Content: Thought content normal.        Judgment: Judgment normal.     Visual Acuity Right Eye Distance:   Left Eye Distance:   Bilateral Distance:    Right Eye Near:   Left Eye Near:    Bilateral Near:     UC Couse / Diagnostics / Procedures:     Radiology No results found.  Procedures Procedures (including critical care time) EKG  Pending results:  Labs Reviewed - No data to display  Medications Ordered in UC: Medications - No data to display  UC Diagnoses / Final Clinical Impressions(s)   I have reviewed the triage vital signs and the nursing notes.  Pertinent labs & imaging results that were available during my care of the patient were reviewed by me and considered in my medical decision making (see chart for details).    Final diagnoses:  Tendinopathy of left wrist   History provided by patient and physical exam findings concerning for acute tendinopathy of tendons and left forearm extending to wrist.  Patient provided with a thumb spica splint to immobilize wrist, Medrol Dosepak and has been advised to ice her wrist for 20 minutes 4 times daily to help relieve swelling.  Note provided for patient to be out of work.  Patient advised to follow-up with  orthopedics if no better in 7 to 10 days, sooner if pain is worsening despite conservative care.  Please see discharge instructions below for details of plan of care as provided to patient. ED Prescriptions  Medication Sig Dispense Auth. Provider   methylPREDNISolone (MEDROL DOSEPAK) 4 MG TBPK tablet Take 24 mg on day 1, 20 mg on day 2, 16 mg on day 3, 12 mg on day 4, 8 mg on day 5, 4 mg on day 6.  Take all tablets in each row at once, do not spread tablets out throughout the day. 21 tablet Theadora Rama Scales, PA-C      PDMP not reviewed this encounter.  Pending results:  Labs Reviewed - No data to display  Discharge Instructions:   Discharge Instructions      Please begin methylprednisolone, which is a high-dose steroid that will reduce inflammation of the tendon in your left wrist.  Please take 1 row of tablets daily with food.  I recommend that you try to take this early in the day.  Please ice your wrist for 20 minutes at least 4 times daily.  Please wear the wrist brace that we provided for you anytime you are active and not at rest.  In 7 to 10 days, if you have not had meaningful improvement of the pain and swelling of your left wrist, please consider following up with orthopedics for further evaluation.  Thank you for visiting [facility] today.  We appreciate the opportunity to participate in your care.      Disposition Upon Discharge:  Condition: stable for discharge home  Patient presented with an acute illness with associated systemic symptoms and significant discomfort requiring urgent management. In my opinion, this is a condition that a prudent lay person (someone who possesses an average knowledge of health and medicine) may potentially expect to result in complications if not addressed urgently such as respiratory distress, impairment of bodily function or dysfunction of bodily organs.   Routine symptom specific, illness specific and/or  disease specific instructions were discussed with the patient and/or caregiver at length.   As such, the patient has been evaluated and assessed, work-up was performed and treatment was provided in alignment with urgent care protocols and evidence based medicine.  Patient/parent/caregiver has been advised that the patient may require follow up for further testing and treatment if the symptoms continue in spite of treatment, as clinically indicated and appropriate.  Patient/parent/caregiver has been advised to return to the [facility] or PCP if no better; to PCP or the Emergency Department if new signs and symptoms develop, or if the current signs or symptoms continue to change or worsen for further workup, evaluation and treatment as clinically indicated and appropriate  The patient will follow up with their current PCP if and as advised. If the patient does not currently have a PCP we will assist them in obtaining one.   The patient may need specialty follow up if the symptoms continue, in spite of conservative treatment and management, for further workup, evaluation, consultation and treatment as clinically indicated and appropriate.  Patient/parent/caregiver verbalized understanding and agreement of plan as discussed.  All questions were addressed during visit.  Please see discharge instructions below for further details of plan.  This office note has been dictated using Teaching laboratory technician.  Unfortunately, this method of dictation can sometimes lead to typographical or grammatical errors.  I apologize for your inconvenience in advance if this occurs.  Please do not hesitate to reach out to me if clarification is needed.      Theadora Rama Scales, PA-C 03/25/23 1151

## 2023-03-25 NOTE — Discharge Instructions (Addendum)
Please begin methylprednisolone, which is a high-dose steroid that will reduce inflammation of the tendon in your left wrist.  Please take 1 row of tablets daily with food.  I recommend that you try to take this early in the day.  Please ice your wrist for 20 minutes at least 4 times daily.  Please wear the wrist brace that we provided for you anytime you are active and not at rest.  In 7 to 10 days, if you have not had meaningful improvement of the pain and swelling of your left wrist, please consider following up with orthopedics for further evaluation.  Thank you for visiting [facility] today.  We appreciate the opportunity to participate in your care.

## 2023-03-25 NOTE — ED Triage Notes (Addendum)
Pt c/o pain and swelling to L wrist. Pt reports yesterday she was carrying boxes and began to notice some pain in the L wrist. She wroked overnight, and the pain worsened with shooting pains traveling up arm. Swollen area noted. Pain is worse with movement.

## 2024-01-18 ENCOUNTER — Encounter (HOSPITAL_COMMUNITY): Payer: Self-pay

## 2024-01-18 ENCOUNTER — Emergency Department (HOSPITAL_COMMUNITY)
Admission: EM | Admit: 2024-01-18 | Discharge: 2024-01-18 | Disposition: A | Discharge status: Home / Self Care | Attending: Emergency Medicine | Admitting: Emergency Medicine

## 2024-01-18 ENCOUNTER — Emergency Department (HOSPITAL_COMMUNITY)

## 2024-01-18 ENCOUNTER — Other Ambulatory Visit: Payer: Self-pay

## 2024-01-18 DIAGNOSIS — S0181XA Laceration without foreign body of other part of head, initial encounter: Secondary | ICD-10-CM | POA: Diagnosis present

## 2024-01-18 DIAGNOSIS — Z91040 Latex allergy status: Secondary | ICD-10-CM | POA: Insufficient documentation

## 2024-01-18 DIAGNOSIS — Y92410 Unspecified street and highway as the place of occurrence of the external cause: Secondary | ICD-10-CM | POA: Insufficient documentation

## 2024-01-18 MED ORDER — METHOCARBAMOL 500 MG PO TABS: 500.0000 mg | ORAL_TABLET | Freq: Three times a day (TID) | ORAL | 0 refills | Status: DC | PRN

## 2024-01-18 MED ORDER — ACETAMINOPHEN 500 MG PO TABS
1000.0000 mg | ORAL_TABLET | Freq: Once | ORAL | Status: AC
  Administered 2024-01-18: 1000 mg via ORAL
  Filled 2024-01-18: qty 2

## 2024-01-18 NOTE — Discharge Instructions (Addendum)
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
## 2024-01-22 ENCOUNTER — Ambulatory Visit: Admitting: Student

## 2024-01-22 ENCOUNTER — Encounter: Payer: Self-pay | Admitting: Student

## 2024-01-22 VITALS — BP 126/78 | HR 80 | Temp 98.6°F | Resp 12 | Ht 63.0 in | Wt 131.8 lb

## 2024-01-22 DIAGNOSIS — Z124 Encounter for screening for malignant neoplasm of cervix: Secondary | ICD-10-CM | POA: Insufficient documentation

## 2024-01-22 DIAGNOSIS — F419 Anxiety disorder, unspecified: Secondary | ICD-10-CM | POA: Insufficient documentation

## 2024-01-22 DIAGNOSIS — G4709 Other insomnia: Secondary | ICD-10-CM | POA: Diagnosis not present

## 2024-01-22 MED ORDER — TRAZODONE HCL 50 MG PO TABS: 25.0000 mg | ORAL_TABLET | Freq: Every evening | ORAL | 3 refills | Status: DC | PRN

## 2024-01-22 NOTE — Patient Instructions (Signed)
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
## 2024-02-24 ENCOUNTER — Encounter: Payer: Self-pay | Admitting: Student

## 2024-02-24 ENCOUNTER — Ambulatory Visit (INDEPENDENT_AMBULATORY_CARE_PROVIDER_SITE_OTHER): Admitting: Student

## 2024-02-24 VITALS — BP 127/83 | HR 62 | Ht 63.0 in | Wt 133.0 lb

## 2024-02-24 DIAGNOSIS — Z0000 Encounter for general adult medical examination without abnormal findings: Secondary | ICD-10-CM

## 2024-02-24 DIAGNOSIS — F411 Generalized anxiety disorder: Secondary | ICD-10-CM | POA: Diagnosis not present

## 2024-02-24 DIAGNOSIS — H9202 Otalgia, left ear: Secondary | ICD-10-CM | POA: Diagnosis not present

## 2024-02-24 DIAGNOSIS — R739 Hyperglycemia, unspecified: Secondary | ICD-10-CM

## 2024-02-24 DIAGNOSIS — Z0001 Encounter for general adult medical examination with abnormal findings: Secondary | ICD-10-CM

## 2024-02-24 DIAGNOSIS — G4709 Other insomnia: Secondary | ICD-10-CM

## 2024-02-24 DIAGNOSIS — Z23 Encounter for immunization: Secondary | ICD-10-CM | POA: Diagnosis not present

## 2024-02-24 DIAGNOSIS — Z136 Encounter for screening for cardiovascular disorders: Secondary | ICD-10-CM | POA: Diagnosis not present

## 2024-02-24 MED ORDER — FLUOXETINE HCL 10 MG PO CAPS: 10.0000 mg | ORAL_CAPSULE | Freq: Every day | ORAL | 1 refills | Status: DC

## 2024-02-24 MED ORDER — TRAZODONE HCL 50 MG PO TABS: 50.0000 mg | ORAL_TABLET | Freq: Every evening | ORAL | 2 refills | Status: AC | PRN
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
## 2024-02-24 NOTE — Assessment & Plan Note (Signed)
 Chronic insomnia with improved sleep duration on 50 mg trazodone , but difficulty with sleep onset. - Increase trazodone  to 50-100 mg at bedtime as needed.

## 2024-02-24 NOTE — Assessment & Plan Note (Signed)
 Patient encouraged to maintain heart healthy diet, regular exercise, adequate sleep. Consider daily probiotics. Take medications as prescribed

## 2024-02-25 ENCOUNTER — Ambulatory Visit: Payer: Self-pay | Admitting: Student

## 2024-02-25 LAB — COMPREHENSIVE METABOLIC PANEL WITH GFR
ALT: 12 U/L (ref 0–35)
AST: 15 U/L (ref 0–37)
Albumin: 4.1 g/dL (ref 3.5–5.2)
Alkaline Phosphatase: 60 U/L (ref 39–117)
BUN: 16 mg/dL (ref 6–23)
CO2: 25 meq/L (ref 19–32)
Calcium: 9.1 mg/dL (ref 8.4–10.5)
Chloride: 103 meq/L (ref 96–112)
Creatinine, Ser: 0.87 mg/dL (ref 0.40–1.20)
GFR: 89.06 mL/min (ref >60.00)
Glucose, Bld: 83 mg/dL (ref 70–99)
Potassium: 4.2 meq/L (ref 3.5–5.1)
Sodium: 136 meq/L (ref 135–145)
Total Bilirubin: 0.4 mg/dL (ref 0.2–1.2)
Total Protein: 6.9 g/dL (ref 6.0–8.3)

## 2024-02-25 LAB — CBC WITH DIFFERENTIAL/PLATELET
Basophils Absolute: 0.1 K/uL (ref 0.0–0.1)
Basophils Relative: 1.3 % (ref 0.0–3.0)
Eosinophils Absolute: 0.1 K/uL (ref 0.0–0.7)
Eosinophils Relative: 1.6 % (ref 0.0–5.0)
HCT: 38.8 % (ref 36.0–46.0)
Hemoglobin: 12.9 g/dL (ref 12.0–15.0)
Lymphocytes Relative: 19.9 % (ref 12.0–46.0)
Lymphs Abs: 1.4 K/uL (ref 0.7–4.0)
MCHC: 33.1 g/dL (ref 30.0–36.0)
MCV: 90.9 fl (ref 78.0–100.0)
Monocytes Absolute: 0.5 K/uL (ref 0.1–1.0)
Monocytes Relative: 6.4 % (ref 3.0–12.0)
Neutro Abs: 5 K/uL (ref 1.4–7.7)
Neutrophils Relative %: 70.8 % (ref 43.0–77.0)
Platelets: 369 K/uL (ref 150.0–400.0)
RBC: 4.27 Mil/uL (ref 3.87–5.11)
RDW: 13.4 % (ref 11.5–15.5)
WBC: 7.1 K/uL (ref 4.0–10.5)

## 2024-02-25 LAB — LIPID PANEL
Cholesterol: 203 mg/dL — ABNORMAL HIGH (ref 0–200)
HDL: 82.7 mg/dL (ref >39.00)
LDL Cholesterol: 110 mg/dL — ABNORMAL HIGH (ref 0–99)
NonHDL: 120.2
Total CHOL/HDL Ratio: 2
Triglycerides: 50 mg/dL (ref 0.0–149.0)
VLDL: 10 mg/dL (ref 0.0–40.0)

## 2024-02-25 LAB — TSH: TSH: 0.9 u[IU]/mL (ref 0.35–5.50)

## 2024-03-26 NOTE — Progress Notes (Signed)
 Subjective:     Patient ID: Rodneshia Greenhouse, female    DOB: 11-May-1991, 33 y.o.   MRN: [id number]  No chief complaint on file.   HPI  Discussed the use of AI scribe software for clinical note transcription with the patient, who gave verbal consent to proceed.  History of Present Illness Carling Liberman is a 33 year old female with anxiety and depression who presents for follow-up of her mental health treatment.  She has been taking Prozac  20 mg daily, with significant improvement in her symptoms.. She feels satisfied with the current dose. A brief period of feeling 'funky' lasted three days, but overall, her symptoms have improved.She is sleeping much better than before.  She continues to use Trazodone  for sleep, taking 50 to 100 mg nightly. Her sleep quality has improved, with no nighttime awakenings and at least six hours of sleep consistently. She follows a bedtime routine of shutting off the TV and listening to sleep sounds, which she finds helpful for sleep.  Her sister has monitored her mood since starting medication and overall has provided positive feedback on patients mood and doing better overall with anxiety/  GAD Fluoxetine  20 mg daily     03/30/2024    3:41 PM 02/24/2024    2:01 PM 01/22/2024   11:18 AM  Depression screen PHQ 2/9  Decreased Interest 0 0 1  Down, Depressed, Hopeless 1 1 0  PHQ - 2 Score 1 1 1   Altered sleeping 0 2 3  Tired, decreased energy 0 2 1  Change in appetite 0 2 3  Feeling bad or failure about yourself  1 1 0  Trouble concentrating 1 2 1   Moving slowly or fidgety/restless 1 2 1   Suicidal thoughts 0 0 0  PHQ-9 Score 4 12 10   Difficult doing work/chores Not difficult at all Somewhat difficult Somewhat difficult       03/30/2024    3:41 PM 02/24/2024    2:02 PM 01/22/2024   11:19 AM  GAD 7 : Generalized Anxiety Score  Nervous, Anxious, on Edge 1 2 3   Control/stop worrying 1 1 2   Worry too much - different things 1 2 2   Trouble relaxing 0 3 3   Restless 1 3 3   Easily annoyed or irritable 1 2 3   Afraid - awful might happen 0 2 3  Total GAD 7 Score 5 15 19   Anxiety Difficulty Not difficult at all Somewhat difficult Somewhat difficult    Insomnia-  50- 100 mg trazodone  HS  LMP: Zafemy 150-35 patch, Current, WNL  Patient denies fever, chills, SOB, CP, palpitations, dyspnea, edema, HA, vision changes, N/V/D, abdominal pain, urinary symptoms, rash, weight changes, and recent illness or hospitalizations.   History of Present Illness              Health Maintenance Due  Topic Date Due   Hepatitis B Vaccines 19-59 Average Risk (1 of 3 - 19+ 3-dose series) Never done   Cervical Cancer Screening (HPV/Pap Cotest)  03/06/2023    Past Medical History:  Diagnosis Date   Headache    MIGRAINES   Medical history non-contributory    Postpartum care following vaginal delivery (12/27) 05/30/2013    Past Surgical History:  Procedure Laterality Date   DILATION AND EVACUATION N/A 08/20/2016   Procedure: DILATATION AND EVACUATION;  Surgeon: [name] [name], MD;  Location: [facility];  Service: Gynecology;  Laterality: N/A;   NO PAST SURGERIES      Family History  Problem  Relation Age of Onset   Moyamoya disease Brother    Asthma Neg Hx    Diabetes Neg Hx    Depression Neg Hx    Heart disease Neg Hx    Hyperlipidemia Neg Hx     Social History   Socioeconomic History   Marital status: Single    Spouse name: Not on file   Number of children: 2   Years of education: Not on file   Highest education level: 12th grade  Occupational History   Not on file  Tobacco Use   Smoking status: Former    Current packs/day: 0.00    Types: Cigarettes    Quit date: 09/01/2012    Years since quitting: 11.5   Smokeless tobacco: Never  Vaping Use   Vaping status: Never Used  Substance and Sexual Activity   Alcohol use: No   Drug use: No   Sexual activity: Yes    Partners: Male    Birth control/protection: None    Comment: 1st  intercourse- 19, partners- 3  Other Topics Concern   Not on file  Social History Narrative   Not on file   Social Drivers of Health   Financial Resource Strain: Low Risk  (02/23/2024)   Overall Financial Resource Strain (CARDIA)    Difficulty of Paying Living Expenses: Not very hard  Food Insecurity: Food Insecurity Present (02/23/2024)   Hunger Vital Sign    Worried About Running Out of Food in the Last Year: Often true    Ran Out of Food in the Last Year: Sometimes true  Transportation Needs: No Transportation Needs (02/23/2024)   PRAPARE - Administrator, Civil Service (Medical): No    Lack of Transportation (Non-Medical): No  Physical Activity: Insufficiently Active (02/23/2024)   Exercise Vital Sign    Days of Exercise per Week: 3 days    Minutes of Exercise per Session: 20 min  Stress: Stress Concern Present (02/23/2024)   [employer] of Occupational Health - Occupational Stress Questionnaire    Feeling of Stress: Rather much  Social Connections: Socially Isolated (02/23/2024)   Social Connection and Isolation Panel    Frequency of Communication with Friends and Family: Once a week    Frequency of Social Gatherings with Friends and Family: Never    Attends Religious Services: Never    Database Administrator or Organizations: No    Attends Engineer, Structural: Not on file    Marital Status: Living with partner  Intimate Partner Violence: Not on file    Outpatient Medications Prior to Visit  Medication Sig Dispense Refill   acetaminophen  (TYLENOL ) 500 MG tablet Take 1,000 mg by mouth every 6 (six) hours as needed for mild pain.     EPINEPHrine 0.3 mg/0.3 mL IJ SOAJ injection Inject 0.3 mg into the muscle as needed. (Patient not taking: Reported on 02/24/2024)     ibuprofen  (ADVIL ,MOTRIN ) 800 MG tablet Take 1 tablet (800 mg total) by mouth every 6 (six) hours. 30 tablet 0   traZODone  (DESYREL ) 50 MG tablet Take 1 tablet (50 mg total) by mouth at  bedtime as needed for sleep (may take 1-2 tabs as needed for sleep). 90 tablet 2   ZAFEMY 150-35 MCG/24HR transdermal patch 1 patch once a week.     FLUoxetine  (PROZAC ) 10 MG capsule Take 1 capsule (10 mg total) by mouth daily. Take 1 capsule (10 mg) daily for 1 week, then increase to 2 capsules (20 mg) week 2 90  capsule 1   No facility-administered medications prior to visit.    Allergies  Allergen Reactions   Bee Venom Anaphylaxis   Latex Hives, Itching, Swelling and Rash    ROS    See HPI Objective:     Physical Exam Constitutional:      General: She is not in acute distress.    Appearance: Normal appearance. She is well-developed. She is not toxic-appearing.  HENT:     Head: Normocephalic and atraumatic.  Eyes:     General:        Right eye: No discharge.        Left eye: No discharge.     Conjunctiva/sclera: Conjunctivae normal.  Cardiovascular:     Rate and Rhythm: Normal rate and regular rhythm.     Heart sounds: Normal heart sounds. No murmur heard. Pulmonary:     Effort: Pulmonary effort is normal. No respiratory distress.     Breath sounds: Normal breath sounds. CTAB.  Abdominal:     Bowel sounds are normal, Abdomen soft. No abdominal tenderness.  Musculoskeletal:        General: Normal range of motion.     Cervical back: Neck supple.  Lymphadenopathy:     Cervical: No cervical adenopathy.  Skin:    General: Skin is warm and dry.  Neurological:     Mental Status: She is alert and oriented to person, place, and time.  Psychiatric:        Mood and Affect: Mood normal.        Behavior: Behavior normal.        Thought Content: Thought content normal.        Judgment: Judgment normal.    BP 120/81   Pulse 68   Ht 5' 3 (1.6 m)   Wt 135 lb 12.8 oz (61.6 kg)   LMP 03/23/2024 (Approximate)   SpO2 100%   BMI 24.06 kg/m  Wt Readings from Last 3 Encounters:  03/30/24 135 lb 12.8 oz (61.6 kg)  02/24/24 133 lb (60.3 kg)  01/22/24 131 lb 12.8 oz (59.8 kg)        Assessment & Plan:   Problem List Items Addressed This Visit     GAD (generalized anxiety disorder) - Primary   Anxiety and depression well-managed with fluoxetine . Significant improvement in GAD and PHQ 9 scores.  - Continue fluoxetine  20 mg daily.       Relevant Medications   FLUoxetine  (PROZAC ) 20 MG capsule   Other insomnia   Well-managed with trazodone . Improved sleep quality with effective dosage range. - Continue trazodone  50 to 100 mg as needed for sleep.      Screening for cervical cancer   Relevant Orders   Ambulatory referral to Gynecology     I have discontinued Raigan Alverson's FLUoxetine . I am also having her start on FLUoxetine . Additionally, I am having her maintain her ibuprofen , acetaminophen , EPINEPHrine, Zafemy, and traZODone .  Meds ordered this encounter  Medications   FLUoxetine  (PROZAC ) 20 MG capsule    Sig: Take 1 capsule (20 mg total) by mouth daily.    Dispense:  90 capsule    Refill:  2    Supervising Provider:   DOMENICA BLACKBIRD A [[id number]]

## 2024-03-30 ENCOUNTER — Ambulatory Visit (INDEPENDENT_AMBULATORY_CARE_PROVIDER_SITE_OTHER): Admitting: Student

## 2024-03-30 ENCOUNTER — Encounter: Payer: Self-pay | Admitting: Student

## 2024-03-30 VITALS — BP 120/81 | HR 68 | Ht 63.0 in | Wt 135.8 lb

## 2024-03-30 DIAGNOSIS — Z124 Encounter for screening for malignant neoplasm of cervix: Secondary | ICD-10-CM | POA: Diagnosis not present

## 2024-03-30 DIAGNOSIS — F411 Generalized anxiety disorder: Secondary | ICD-10-CM | POA: Diagnosis not present

## 2024-03-30 DIAGNOSIS — G4709 Other insomnia: Secondary | ICD-10-CM

## 2024-03-30 MED ORDER — FLUOXETINE HCL 20 MG PO CAPS: 20.0000 mg | ORAL_CAPSULE | Freq: Every day | ORAL | 2 refills | Status: AC

## 2024-03-30 NOTE — Patient Instructions (Addendum)
 [facility] for [facility] at [facility]  [phone]

## 2024-03-30 NOTE — Assessment & Plan Note (Signed)
 Anxiety and depression well-managed with fluoxetine . Significant improvement in GAD and PHQ 9 scores.  - Continue fluoxetine  20 mg daily.

## 2024-03-30 NOTE — Assessment & Plan Note (Signed)
 Well-managed with trazodone . Improved sleep quality with effective dosage range. - Continue trazodone  50 to 100 mg as needed for sleep.

## 2024-09-28 ENCOUNTER — Ambulatory Visit: Admitting: Student
